# Patient Record
Sex: Female | Born: 1980 | Race: White | Hispanic: No | Marital: Married
Health system: Northeastern US, Academic
[De-identification: ages and names within clinical notes are randomized; demographics above are authoritative.]

## PROBLEM LIST (undated history)

## (undated) ENCOUNTER — Inpatient Hospital Stay (HOSPITAL_COMMUNITY): Payer: Self-pay

## (undated) DIAGNOSIS — D62 Acute posthemorrhagic anemia: Secondary | ICD-10-CM

## (undated) DIAGNOSIS — Z789 Other specified health status: Secondary | ICD-10-CM

## (undated) DIAGNOSIS — O481 Prolonged pregnancy: Secondary | ICD-10-CM

## (undated) HISTORY — PX: WISDOM TOOTH EXTRACTION: SHX21

---

## 2007-11-01 ENCOUNTER — Encounter: Admission: RE | Admit: 2007-11-01 | Discharge: 2007-11-01 | Payer: Self-pay | Admitting: Internal Medicine

## 2008-05-13 ENCOUNTER — Emergency Department (HOSPITAL_COMMUNITY): Admission: EM | Admit: 2008-05-13 | Discharge: 2008-05-13 | Payer: Self-pay | Admitting: Family Medicine

## 2009-10-12 IMAGING — US US PELVIS COMPLETE
1 series · 14 of 25 positions shown · non-contrast
Comparison: None

CLINICAL DATA: Right pelvic pain and pressure.

TRANSABDOMINAL AND TRANSVAGINAL PELVIC ULTRASOUND
TECHNIQUE: Both transabdominal and transvaginal ultrasound examinations of the
pelvis were performed including evaluation of the uterus, ovaries, adnexal
regions, and pelvic cul-de-sac.

[Series 1: us pelvis complete · 0.22mm/px · 14 of 45 slices shown]
[im 1/45]
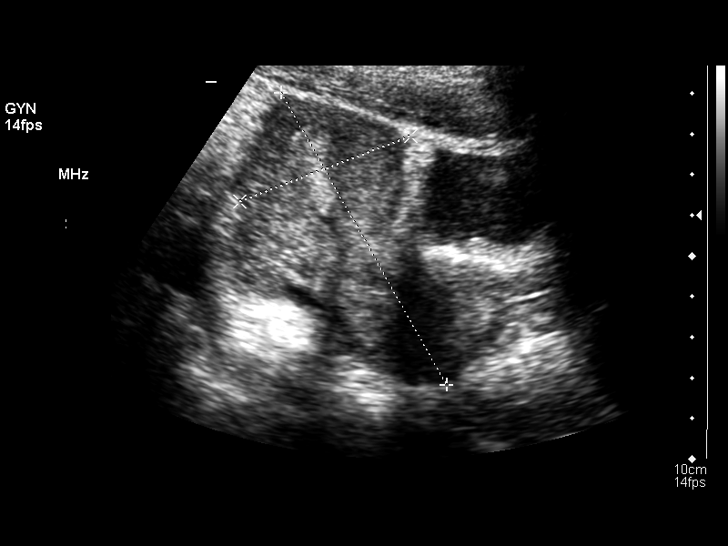
[im 4/45]
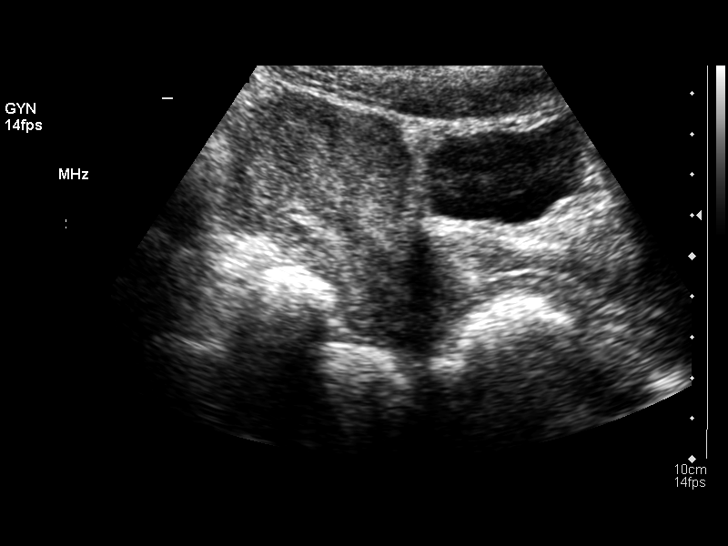
[im 8/45]
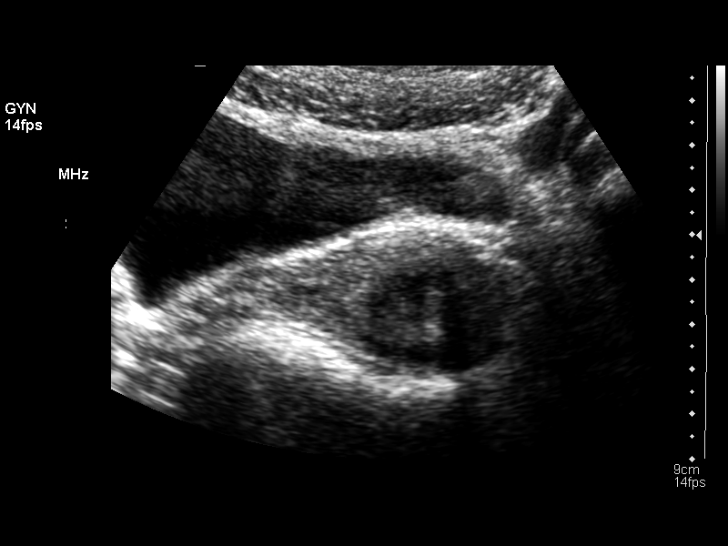
[im 12/45]
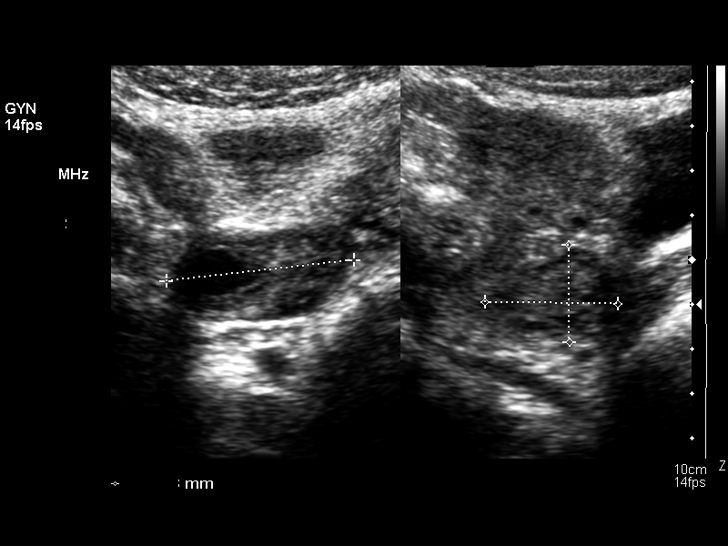
[im 15/45]
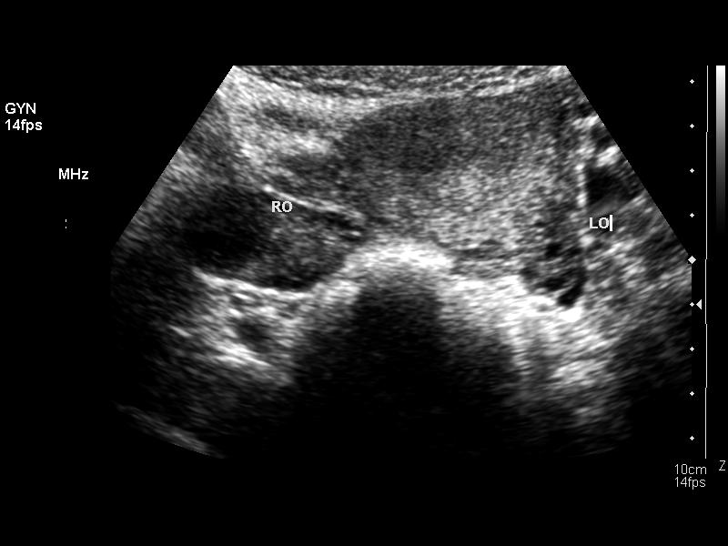
[im 17/45]
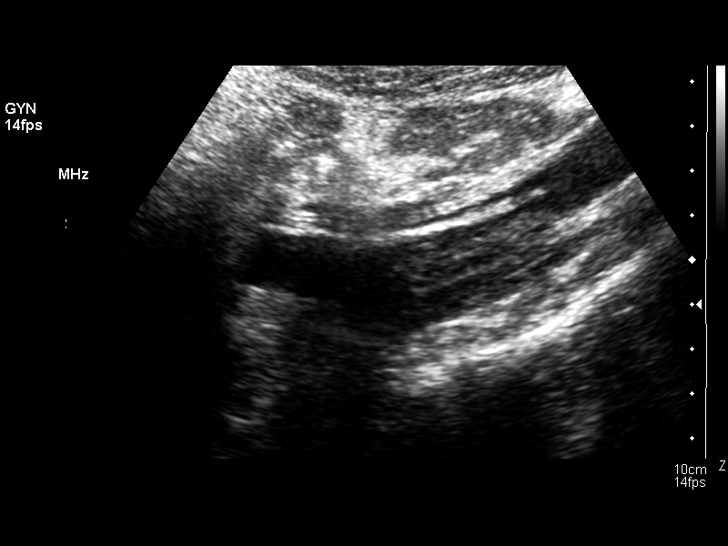
[im 21/45]
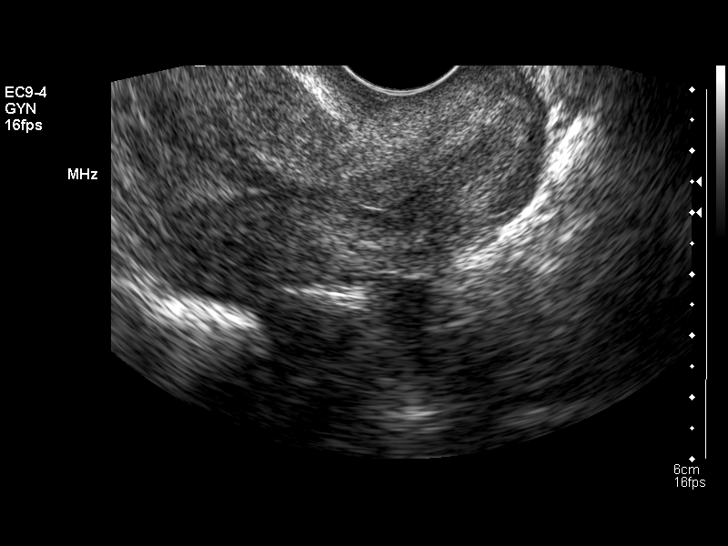
[im 24/45]
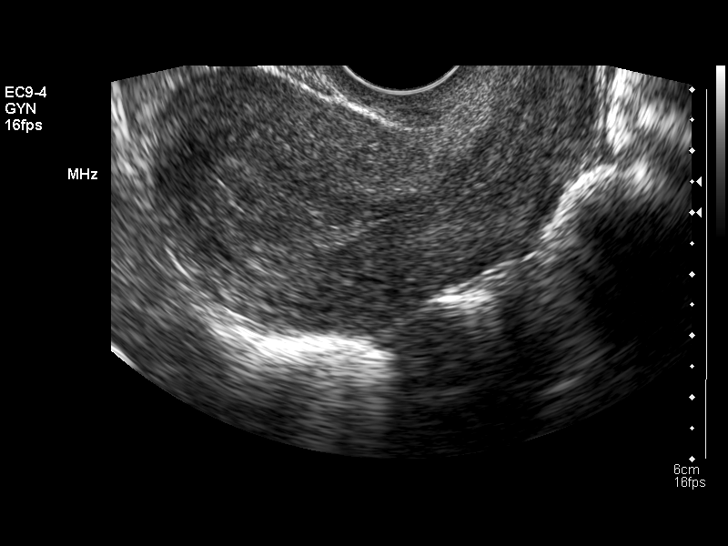
[im 28/45]
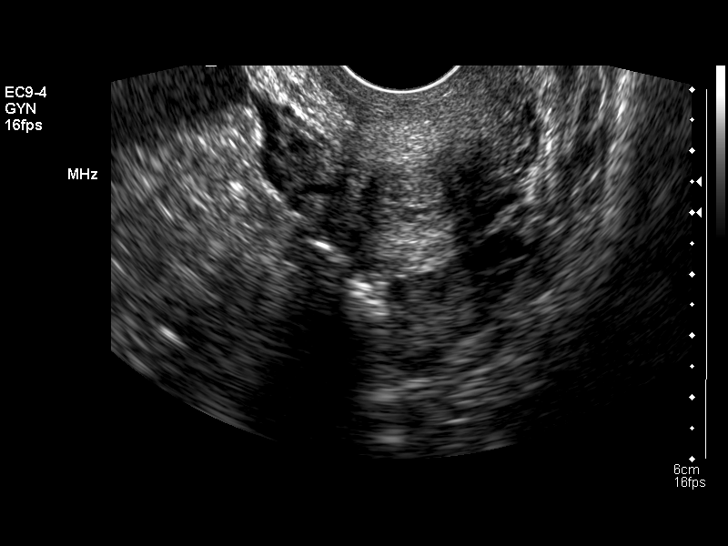
[im 30/45]
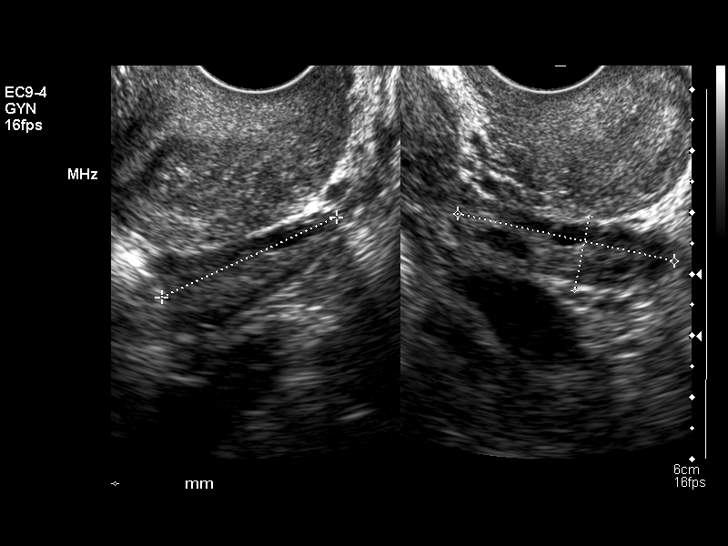
[im 34/45]
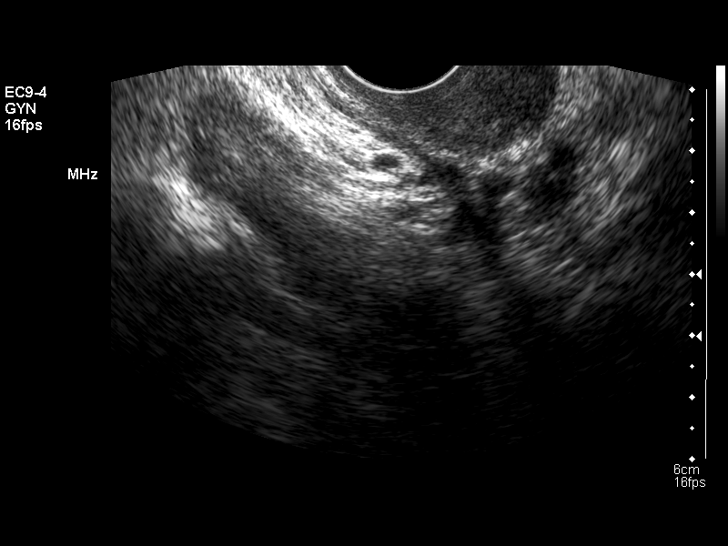
[im 37/45]
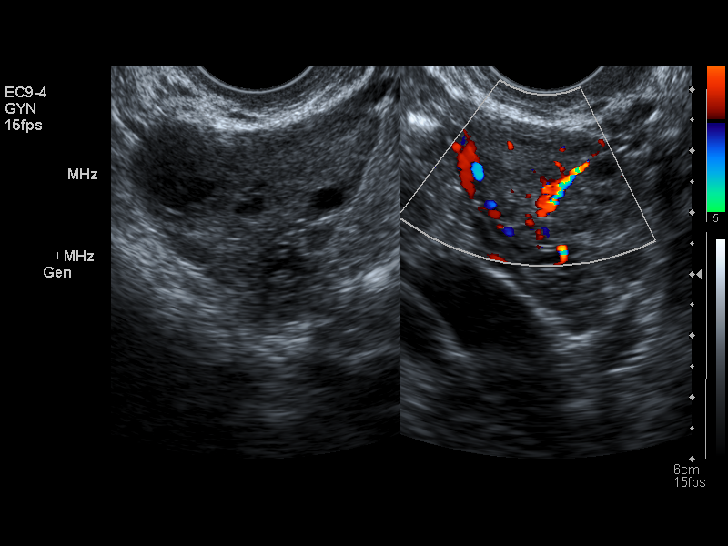
[im 41/45]
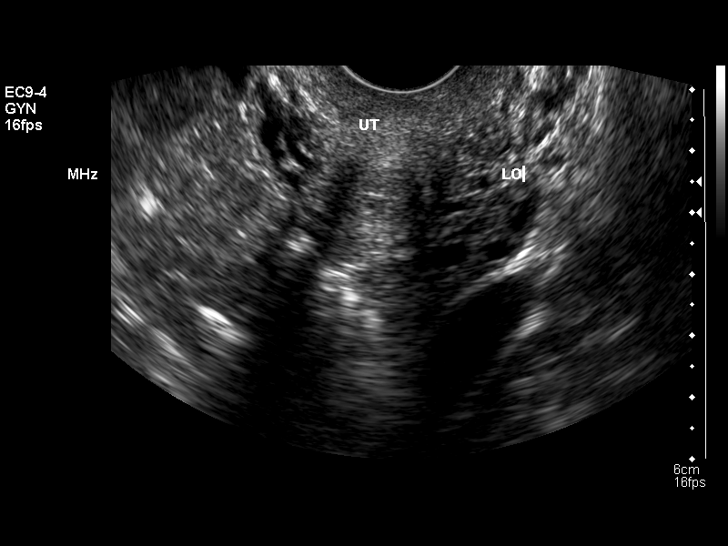
[im 45/45]
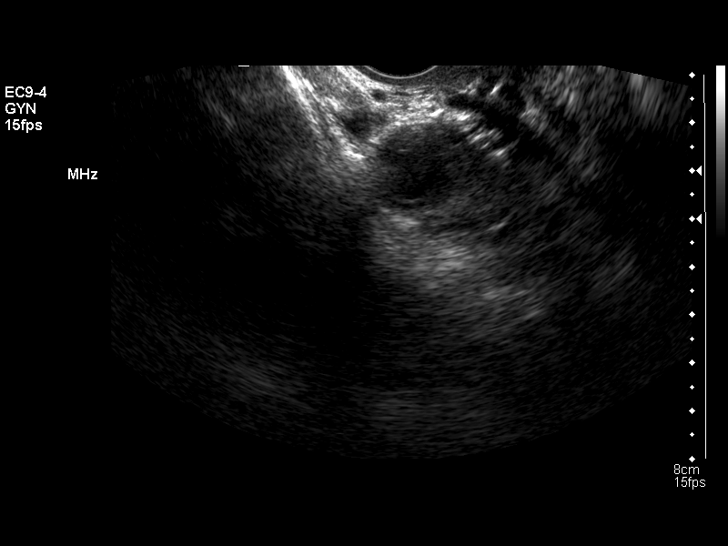

[14 of 25 positions shown; findings below may reference images not displayed]

FINDINGS: Uterus 7.7 x 4.5 x 5.4 cm. Endometrium normal at 1 cm.

Right ovary 3.7 x 2.7 x 4.3 cm. 1.4 cm hypoechoic lesion within is likely a
minimally complex follicle.

Left ovary 3.6 x 1.2 x 3.1 cm. Normal morphology. 

No significant free fluid.

IMPRESSION

1. No acute pelvic process. 
2. likely minimally complex follicle within the right ovary.

## 2011-08-31 LAB — URINE CULTURE

## 2011-08-31 LAB — POCT URINALYSIS DIP (DEVICE)
Glucose, UA: 250 — AB
Operator id: 239701
Specific Gravity, Urine: 1.015
Urobilinogen, UA: 2 — ABNORMAL HIGH

## 2013-12-04 HISTORY — PX: WISDOM TOOTH EXTRACTION: SHX21

## 2014-04-20 ENCOUNTER — Other Ambulatory Visit (HOSPITAL_COMMUNITY)
Admission: RE | Admit: 2014-04-20 | Discharge: 2014-04-20 | Disposition: A | Discharge status: Home / Self Care | Payer: BC Managed Care – PPO | Source: Ambulatory Visit | Attending: Obstetrics & Gynecology | Admitting: Obstetrics & Gynecology

## 2014-04-20 ENCOUNTER — Other Ambulatory Visit: Payer: Self-pay | Admitting: Obstetrics & Gynecology

## 2014-04-20 DIAGNOSIS — Z01419 Encounter for gynecological examination (general) (routine) without abnormal findings: Secondary | ICD-10-CM | POA: Insufficient documentation

## 2014-04-20 DIAGNOSIS — Z1151 Encounter for screening for human papillomavirus (HPV): Secondary | ICD-10-CM | POA: Insufficient documentation

## 2015-08-09 ENCOUNTER — Encounter (HOSPITAL_COMMUNITY): Payer: Self-pay | Admitting: *Deleted

## 2015-08-09 ENCOUNTER — Inpatient Hospital Stay (HOSPITAL_COMMUNITY)
Admission: AD | Admit: 2015-08-09 | Discharge: 2015-08-10 | Disposition: A | Discharge status: Home / Self Care | Payer: BC Managed Care – PPO | Source: Ambulatory Visit | Attending: Obstetrics & Gynecology | Admitting: Obstetrics & Gynecology

## 2015-08-09 DIAGNOSIS — O209 Hemorrhage in early pregnancy, unspecified: Secondary | ICD-10-CM | POA: Diagnosis not present

## 2015-08-09 DIAGNOSIS — O4691 Antepartum hemorrhage, unspecified, first trimester: Secondary | ICD-10-CM | POA: Diagnosis not present

## 2015-08-09 DIAGNOSIS — Z3A10 10 weeks gestation of pregnancy: Secondary | ICD-10-CM | POA: Diagnosis not present

## 2015-08-09 LAB — URINALYSIS, ROUTINE W REFLEX MICROSCOPIC
BILIRUBIN URINE: NEGATIVE
Glucose, UA: NEGATIVE mg/dL
KETONES UR: NEGATIVE mg/dL
Leukocytes, UA: NEGATIVE
NITRITE: NEGATIVE
PROTEIN: NEGATIVE mg/dL
UROBILINOGEN UA: 0.2 mg/dL (ref 0.0–1.0)
pH: 6 (ref 5.0–8.0)

## 2015-08-09 LAB — URINE MICROSCOPIC-ADD ON

## 2015-08-09 NOTE — MAU Note (Signed)
Pt states that she had a "gush of clear fluid around 8pm tonight, then about 2 hours later she had bright red bleeding (no clots) and it was less than a period". Denies pain, states she had very mild cramping earlier but nothing that she was concerned about. She did hike 4 miles today.

## 2015-08-10 ENCOUNTER — Inpatient Hospital Stay (HOSPITAL_COMMUNITY): Payer: BC Managed Care – PPO

## 2015-08-10 DIAGNOSIS — O4691 Antepartum hemorrhage, unspecified, first trimester: Secondary | ICD-10-CM | POA: Diagnosis not present

## 2015-08-10 LAB — WET PREP, GENITAL
Clue Cells Wet Prep HPF POC: NONE SEEN
Trich, Wet Prep: NONE SEEN
Yeast Wet Prep HPF POC: NONE SEEN

## 2015-08-10 LAB — CBC
HCT: 32.6 % — ABNORMAL LOW (ref 36.0–46.0)
HEMOGLOBIN: 11.3 g/dL — AB (ref 12.0–15.0)
MCH: 28.8 pg (ref 26.0–34.0)
MCHC: 34.7 g/dL (ref 30.0–36.0)
MCV: 83.2 fL (ref 78.0–100.0)
Platelets: 236 10*3/uL (ref 150–400)
RBC: 3.92 MIL/uL (ref 3.87–5.11)
RDW: 12.7 % (ref 11.5–15.5)
WBC: 9.7 10*3/uL (ref 4.0–10.5)

## 2015-08-10 LAB — ABO/RH: ABO/RH(D): A NEG

## 2015-08-10 MED ORDER — RHO D IMMUNE GLOBULIN 1500 UNIT/2ML IJ SOSY
300.0000 ug | PREFILLED_SYRINGE | Freq: Once | INTRAMUSCULAR | Status: AC
  Administered 2015-08-10: 300 ug via INTRAMUSCULAR
  Filled 2015-08-10: qty 2

## 2015-08-10 NOTE — Discharge Instructions (Signed)
Vaginal Bleeding During Pregnancy, First Trimester  A small amount of bleeding (spotting) from the vagina is relatively common in early pregnancy. It usually stops on its own. Various things may cause bleeding or spotting in early pregnancy. Some bleeding may be related to the pregnancy, and some may not. In most cases, the bleeding is normal and is not a problem. However, bleeding can also be a sign of something serious. Be sure to tell your health care provider about any vaginal bleeding right away.  Some possible causes of vaginal bleeding during the first trimester include:  · Infection or inflammation of the cervix.  · Growths (polyps) on the cervix.  · Miscarriage or threatened miscarriage.  · Pregnancy tissue has developed outside of the uterus and in a fallopian tube (tubal pregnancy).  · Tiny cysts have developed in the uterus instead of pregnancy tissue (molar pregnancy).  HOME CARE INSTRUCTIONS   Watch your condition for any changes. The following actions may help to lessen any discomfort you are feeling:  · Follow your health care provider's instructions for limiting your activity. If your health care provider orders bed rest, you may need to stay in bed and only get up to use the bathroom. However, your health care provider may allow you to continue light activity.  · If needed, make plans for someone to help with your regular activities and responsibilities while you are on bed rest.  · Keep track of the number of pads you use each day, how often you change pads, and how soaked (saturated) they are. Write this down.  · Do not use tampons. Do not douche.  · Do not have sexual intercourse or orgasms until approved by your health care provider.  · If you pass any tissue from your vagina, save the tissue so you can show it to your health care provider.  · Only take over-the-counter or prescription medicines as directed by your health care provider.  · Do not take aspirin because it can make you  bleed.  · Keep all follow-up appointments as directed by your health care provider.  SEEK MEDICAL CARE IF:  · You have any vaginal bleeding during any part of your pregnancy.  · You have cramps or labor pains.  · You have a fever, not controlled by medicine.  SEEK IMMEDIATE MEDICAL CARE IF:   · You have severe cramps in your back or belly (abdomen).  · You pass large clots or tissue from your vagina.  · Your bleeding increases.  · You feel light-headed or weak, or you have fainting episodes.  · You have chills.  · You are leaking fluid or have a gush of fluid from your vagina.  · You pass out while having a bowel movement.  MAKE SURE YOU:  · Understand these instructions.  · Will watch your condition.  · Will get help right away if you are not doing well or get worse.  Document Released: 08/30/2005 Document Revised: 11/25/2013 Document Reviewed: 07/28/2013  ExitCare® Patient Information ©2015 [employer]. This information is not intended to replace advice given to you by your health care provider. Make sure you discuss any questions you have with your health care provider.

## 2015-08-10 NOTE — MAU Provider Note (Signed)
Chief Complaint: Vaginal Bleeding   First Provider Initiated Contact with Patient 08/09/15 2338      SUBJECTIVE HPI: Stacy Sawyer is a 34 y.o. G2P0010 at [redacted]w[redacted]d by LMP who presents to maternity admissions reporting onset of bright red vaginal bleeding tonight.  She reports a small gush of clear fluid a few hours ago, then bright red vaginal bleeding, lighter than period bleeding without clots, a couple of hours later.  She denies abdominal pain and reports bleeding has stopped by her arrival in MAU.  She has started prenatal care and had initial labs and [place] at [facility].  She does report walking/hiking 4 miles today prior to the bleeding.  She denies vaginal itching/burning, urinary symptoms, h/a, dizziness, n/v, or fever/chills.     Vaginal Bleeding The patient's primary symptoms include vaginal bleeding. The patient's pertinent negatives include no pelvic pain or vaginal discharge. This is a new problem. The current episode started today. Episode frequency: once. The problem has been gradually improving. The patient is experiencing no pain. She is pregnant. Pertinent negatives include no abdominal pain, chills, constipation, diarrhea, dysuria, fever, flank pain, frequency, headaches, nausea or vomiting. The vaginal bleeding is lighter than menses. She has not been passing clots. She has not been passing tissue. The symptoms are aggravated by activity. She has tried nothing for the symptoms. She is sexually active. No, her partner does not have an STD. Her past medical history is significant for miscarriage.    History reviewed. No pertinent past medical history. Past Surgical History  Procedure Laterality Date  . Wisdom tooth extraction  2015   Social History   Social History  . Marital Status: Single    Spouse Name: N/A  . Number of Children: N/A  . Years of Education: N/A   Occupational History  . Not on file.   Social History Main Topics  . Smoking status: Never Smoker   .  Smokeless tobacco: Never Used  . Alcohol Use: No  . Drug Use: No  . Sexual Activity: Yes   Other Topics Concern  . Not on file   Social History Narrative   No current facility-administered medications on file prior to encounter.   No current outpatient prescriptions on file prior to encounter.   Allergies  Allergen Reactions  . Prenatal Vitamins     ROS:  Review of Systems  Constitutional: Negative for fever, chills and fatigue.  HENT: Negative for sinus pressure.   Eyes: Negative for photophobia.  Respiratory: Negative for shortness of breath.   Cardiovascular: Negative for chest pain.  Gastrointestinal: Negative for nausea, vomiting, abdominal pain, diarrhea and constipation.  Genitourinary: Positive for vaginal bleeding. Negative for dysuria, frequency, flank pain, vaginal discharge, difficulty urinating, vaginal pain and pelvic pain.  Musculoskeletal: Negative for neck pain.  Neurological: Negative for dizziness, weakness and headaches.  Psychiatric/Behavioral: Negative.      I have reviewed patient's Past Medical Hx, Surgical Hx, Family Hx, Social Hx, medications and allergies.   Physical Exam   Patient Vitals for the past 24 hrs:  BP Temp Temp src Pulse Resp Height Weight  08/09/15 2328 125/81 mmHg 97.7 F (36.5 C) Oral 86 18  (1.499 m) 57.153 kg (126 lb)   Constitutional: Well-developed, well-nourished female in no acute distress.  Cardiovascular: normal rate Respiratory: normal effort GI: Abd soft, non-tender. Pos BS x 4 MS: Extremities nontender, no edema, normal ROM Neurologic: Alert and oriented x 4.  GU: Neg CVAT.  PELVIC EXAM: Cervix pink, visually closed,  with 0.5 cm round flesh-colored smooth lesion, small amount dark red/brown bleeding, vaginal walls and external genitalia normal   LAB RESULTS Results for orders placed or performed during the hospital encounter of 08/09/15 (from the past 24 hour(s))  Urinalysis, Routine w reflex  microscopic (not at [facility])     Status: Abnormal   Collection Time: 08/09/15 11:00 PM  Result Value Ref Range   Color, Urine YELLOW YELLOW   APPearance CLEAR CLEAR   Specific Gravity, Urine <1.005 (L) 1.005 - 1.030   pH 6.0 5.0 - 8.0   Glucose, UA NEGATIVE NEGATIVE mg/dL   Hgb urine dipstick LARGE (A) NEGATIVE   Bilirubin Urine NEGATIVE NEGATIVE   Ketones, ur NEGATIVE NEGATIVE mg/dL   Protein, ur NEGATIVE NEGATIVE mg/dL   Urobilinogen, UA 0.2 0.0 - 1.0 mg/dL   Nitrite NEGATIVE NEGATIVE   Leukocytes, UA NEGATIVE NEGATIVE  Urine microscopic-add on     Status: None   Collection Time: 08/09/15 11:00 PM  Result Value Ref Range   Bacteria, UA RARE RARE  Wet prep, genital     Status: Abnormal   Collection Time: 08/09/15 11:55 PM  Result Value Ref Range   Yeast Wet Prep HPF POC NONE SEEN NONE SEEN   Trich, Wet Prep NONE SEEN NONE SEEN   Clue Cells Wet Prep HPF POC NONE SEEN NONE SEEN   WBC, Wet Prep HPF POC FEW (A) NONE SEEN  ABO/Rh     Status: None (Preliminary result)   Collection Time: 08/10/15  1:05 AM  Result Value Ref Range   ABO/RH(D) A NEG   CBC     Status: Abnormal   Collection Time: 08/10/15  1:05 AM  Result Value Ref Range   WBC 9.7 4.0 - 10.5 K/uL   RBC 3.92 3.87 - 5.11 MIL/uL   Hemoglobin 11.3 (L) 12.0 - 15.0 g/dL   HCT 16.1 (L) 09.6 - 04.5 %   MCV 83.2 78.0 - 100.0 fL   MCH 28.8 26.0 - 34.0 pg   MCHC 34.7 30.0 - 36.0 g/dL   RDW 40.9 81.1 - 91.4 %   Platelets 236 150 - 400 K/uL    --/--/A NEG (09/06 0105)  IMAGING US Ob Comp Less 14 Wks  08/10/2015   CLINICAL DATA:  34 year old pregnant female with vaginal bleeding.  EXAM: OBSTETRIC <14 WK [place] AND TRANSVAGINAL OB US  TECHNIQUE: Transabdominal ultrasound examinations were performed for evaluation of the gestation as well as the maternal uterus, adnexal regions, and pelvic cul-de-sac.  COMPARISON:  None for this pregnancy  FINDINGS: Intrauterine gestational sac: Visualized/normal in shape.  Yolk sac:  Present  Embryo:   Seen  Cardiac Activity: Detected  Heart Rate: 170  bpm  CRL:  33  mm   10 w   1 d                  [place] EDC: 03/06/2016  Maternal uterus/adnexae: The ovaries are grossly unremarkable. A probable left ovarian corpus luteum noted. 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  IMPRESSION: Single live intrauterine pregnancy with an estimated gestational age of [redacted] weeks, 1 day.   Electronically Signed   By: Elgie Collard M.D.   On: 08/10/2015 01:22    MAU Management/MDM: Ordered labs and reviewed results.  Consult Dr Normand Sloop, reviewed assessment, labs, imaging.  Treatments in MAU included Rhophylac for Rh negative. Pt stable at time of discharge.  ASSESSMENT 1. Vaginal bleeding  in pregnancy, first trimester     PLAN Discharge home Call office this week to follow up Education done regarding negative blood type and Rhophylac Bleeding precautions given/reasons to return to MAU  Follow-up Information    Follow up with Myna Hidalgo, M, DO.   Specialty:  Obstetrics and Gynecology   Why:  Call office to notify of MAU visit.  Follow up as scheduled.   Contact information:   [address] [place] [address] [phone]       Follow up with [facility].   Why:  As needed for emergencies   Contact information:   [address] [id number] mc [place] [address] [phone]      Sharen Counter Certified Nurse-Midwife 08/10/2015  1:45 AM

## 2015-08-11 LAB — RH IG WORKUP (INCLUDES ABO/RH)
ABO/RH(D): A NEG
ANTIBODY SCREEN: NEGATIVE
GESTATIONAL AGE(WKS): 10
Unit division: 0

## 2015-08-25 LAB — OB RESULTS CONSOLE RUBELLA ANTIBODY, IGM: RUBELLA: IMMUNE

## 2015-08-25 LAB — OB RESULTS CONSOLE GC/CHLAMYDIA
CHLAMYDIA, DNA PROBE: NEGATIVE
Gonorrhea: NEGATIVE

## 2015-08-25 LAB — OB RESULTS CONSOLE RPR: RPR: NONREACTIVE

## 2015-08-25 LAB — OB RESULTS CONSOLE ABO/RH: RH TYPE: NEGATIVE

## 2015-08-25 LAB — OB RESULTS CONSOLE HIV ANTIBODY (ROUTINE TESTING): HIV: NONREACTIVE

## 2015-08-25 LAB — OB RESULTS CONSOLE ANTIBODY SCREEN: ANTIBODY SCREEN: NEGATIVE

## 2015-08-25 LAB — OB RESULTS CONSOLE HEPATITIS B SURFACE ANTIGEN: HEP B S AG: NEGATIVE

## 2015-11-03 ENCOUNTER — Encounter (HOSPITAL_COMMUNITY): Payer: Self-pay | Admitting: *Deleted

## 2015-11-03 ENCOUNTER — Inpatient Hospital Stay (HOSPITAL_COMMUNITY)
Admission: AD | Admit: 2015-11-03 | Discharge: 2015-11-03 | Disposition: A | Discharge status: Home / Self Care | Payer: BC Managed Care – PPO | Source: Ambulatory Visit | Attending: Obstetrics & Gynecology | Admitting: Obstetrics & Gynecology

## 2015-11-03 DIAGNOSIS — R197 Diarrhea, unspecified: Secondary | ICD-10-CM | POA: Diagnosis not present

## 2015-11-03 DIAGNOSIS — O26892 Other specified pregnancy related conditions, second trimester: Secondary | ICD-10-CM | POA: Diagnosis not present

## 2015-11-03 DIAGNOSIS — R109 Unspecified abdominal pain: Secondary | ICD-10-CM | POA: Diagnosis present

## 2015-11-03 DIAGNOSIS — Z3A22 22 weeks gestation of pregnancy: Secondary | ICD-10-CM | POA: Diagnosis not present

## 2015-11-03 LAB — COMPREHENSIVE METABOLIC PANEL
ALBUMIN: 3.5 g/dL (ref 3.5–5.0)
ALT: 18 U/L (ref 14–54)
AST: 20 U/L (ref 15–41)
Alkaline Phosphatase: 39 U/L (ref 38–126)
Anion gap: 8 (ref 5–15)
BUN: 7 mg/dL (ref 6–20)
CHLORIDE: 101 mmol/L (ref 101–111)
CO2: 24 mmol/L (ref 22–32)
CREATININE: 0.41 mg/dL — AB (ref 0.44–1.00)
Calcium: 9.2 mg/dL (ref 8.9–10.3)
GFR calc Af Amer: >60 mL/min (ref >60)
GFR calc non Af Amer: >60 mL/min (ref >60)
Glucose, Bld: 108 mg/dL — ABNORMAL HIGH (ref 65–99)
Potassium: 3.6 mmol/L (ref 3.5–5.1)
SODIUM: 133 mmol/L — AB (ref 135–145)
Total Bilirubin: 0.3 mg/dL (ref 0.3–1.2)
Total Protein: 7.6 g/dL (ref 6.5–8.1)

## 2015-11-03 LAB — URINALYSIS, ROUTINE W REFLEX MICROSCOPIC
BILIRUBIN URINE: NEGATIVE
Glucose, UA: NEGATIVE mg/dL
Ketones, ur: NEGATIVE mg/dL
NITRITE: NEGATIVE
PH: 5.5 (ref 5.0–8.0)
Protein, ur: NEGATIVE mg/dL
Specific Gravity, Urine: 1.01 (ref 1.005–1.030)

## 2015-11-03 LAB — CBC
HCT: 32.3 % — ABNORMAL LOW (ref 36.0–46.0)
Hemoglobin: 11.3 g/dL — ABNORMAL LOW (ref 12.0–15.0)
MCH: 29.7 pg (ref 26.0–34.0)
MCHC: 35 g/dL (ref 30.0–36.0)
MCV: 84.8 fL (ref 78.0–100.0)
PLATELETS: 256 10*3/uL (ref 150–400)
RBC: 3.81 MIL/uL — AB (ref 3.87–5.11)
RDW: 13.5 % (ref 11.5–15.5)
WBC: 11.2 10*3/uL — AB (ref 4.0–10.5)

## 2015-11-03 LAB — URINE MICROSCOPIC-ADD ON

## 2015-11-03 NOTE — MAU Provider Note (Signed)
History     CSN: [id number]  Arrival date and time: 11/03/15 2022   First Provider Initiated Contact with Patient 11/03/15 2129      Chief Complaint  Patient presents with  . Abdominal Cramping   HPI Comments: Stacy Sawyer is a 34 y.o. G2P0010 at [redacted]w[redacted]d who presents today with abdominal cramping. She states that she and her husband had food poisoning two days ago. She states that she ate smoked salmon that was out of date. She had vomiting and diarrhea, and those have stopped at this time. She states that she continues to have lower abdominal cramping and "tightness".   Abdominal Cramping This is a new problem. The current episode started in the past 7 days. The onset quality is gradual. The problem occurs intermittently. The problem has been unchanged. The pain is located in the suprapubic region. The pain is at a severity of 6/10. The quality of the pain is cramping. Pertinent negatives include no constipation, diarrhea, dysuria, fever, frequency, nausea or vomiting. Nothing aggravates the pain. The pain is relieved by certain positions and recumbency. She has tried acetaminophen for the symptoms. The treatment provided mild relief.     History reviewed. No pertinent past medical history.  Past Surgical History  Procedure Laterality Date  . Wisdom tooth extraction  2015    History reviewed. No pertinent family history.  Social History  Substance Use Topics  . Smoking status: Never Smoker   . Smokeless tobacco: Never Used  . Alcohol Use: No    Allergies: No Known Allergies  Prescriptions prior to admission  Medication Sig Dispense Refill Last Dose  . acetaminophen (TYLENOL) 500 MG tablet Take 1,000 mg by mouth every 6 (six) hours as needed for mild pain.   11/03/2015 at Unknown time  . omega-3 acid ethyl esters (LOVAZA) 1 G capsule Take 1 g by mouth 2 (two) times daily.   Past Week at Unknown time  . Prenatal Vit-Fe Fumarate-FA (PRENATAL MULTIVITAMIN) TABS tablet Take 1  tablet by mouth daily at 12 noon.   11/03/2015 at Unknown time    Review of Systems  Constitutional: Negative for fever and chills.  Gastrointestinal: Positive for abdominal pain. Negative for nausea, vomiting, diarrhea and constipation.  Genitourinary: Negative for dysuria, urgency and frequency.   Physical Exam   Blood pressure 121/68, pulse 106, temperature 98.2 F (36.8 C), temperature source Oral, resp. rate 18, height  (1.499 m), weight 65.772 kg (145 lb).  Physical Exam  Nursing note and vitals reviewed. Constitutional: She is oriented to person, place, and time. She appears well-developed and well-nourished. No distress.  HENT:  Head: Normocephalic.  Cardiovascular: Normal rate.   Respiratory: Effort normal.  GI: Soft. There is no tenderness. There is no rebound.  Genitourinary:   Cervix: closed/thick/high  FHT with doppler 152 BPM   Neurological: She is alert and oriented to person, place, and time.  Skin: Skin is warm and dry.  Psychiatric: She has a normal mood and affect.   Results for orders placed or performed during the hospital encounter of 11/03/15 (from the past 24 hour(s))  Urinalysis, Routine w reflex microscopic (not at [facility])     Status: Abnormal   Collection Time: 11/03/15  8:37 PM  Result Value Ref Range   Color, Urine YELLOW YELLOW   APPearance CLEAR CLEAR   Specific Gravity, Urine 1.010 1.005 - 1.030   pH 5.5 5.0 - 8.0   Glucose, UA NEGATIVE NEGATIVE mg/dL   Hgb urine dipstick SMALL (  A) NEGATIVE   Bilirubin Urine NEGATIVE NEGATIVE   Ketones, ur NEGATIVE NEGATIVE mg/dL   Protein, ur NEGATIVE NEGATIVE mg/dL   Nitrite NEGATIVE NEGATIVE   Leukocytes, UA TRACE (A) NEGATIVE  Urine microscopic-add on     Status: Abnormal   Collection Time: 11/03/15  8:37 PM  Result Value Ref Range   Squamous Epithelial / LPF 0-5 (A) NONE SEEN   WBC, UA 0-5 0 - 5 WBC/hpf   RBC / HPF 0-5 0 - 5 RBC/hpf   Bacteria, UA RARE (A) NONE SEEN  CBC     Status: Abnormal    Collection Time: 11/03/15  9:50 PM  Result Value Ref Range   WBC 11.2 (H) 4.0 - 10.5 K/uL   RBC 3.81 (L) 3.87 - 5.11 MIL/uL   Hemoglobin 11.3 (L) 12.0 - 15.0 g/dL   HCT 16.132.3 (L) 09.636.0 - 04.546.0 %   MCV 84.8 78.0 - 100.0 fL   MCH 29.7 26.0 - 34.0 pg   MCHC 35.0 30.0 - 36.0 g/dL   RDW 40.913.5 81.111.5 - 91.415.5 %   Platelets 256 150 - 400 K/uL  Comprehensive metabolic panel     Status: Abnormal   Collection Time: 11/03/15  9:50 PM  Result Value Ref Range   Sodium 133 (L) 135 - 145 mmol/L   Potassium 3.6 3.5 - 5.1 mmol/L   Chloride 101 101 - 111 mmol/L   CO2 24 22 - 32 mmol/L   Glucose, Bld 108 (H) 65 - 99 mg/dL   BUN 7 6 - 20 mg/dL   Creatinine, Ser 7.820.41 (L) 0.44 - 1.00 mg/dL   Calcium 9.2 8.9 - 95.610.3 mg/dL   Total Protein 7.6 6.5 - 8.1 g/dL   Albumin 3.5 3.5 - 5.0 g/dL   AST 20 15 - 41 U/L   ALT 18 14 - 54 U/L   Alkaline Phosphatase 39 38 - 126 U/L   Total Bilirubin 0.3 0.3 - 1.2 mg/dL   GFR calc non Af Amer >60 >60 mL/min   GFR calc Af Amer >60 >60 mL/min   Anion gap 8 5 - 15     MAU Course  Procedures  MDM   Assessment and Plan   1. Diarrhea, unspecified type    DC home Comfort measures reviewed  2nd Trimester precautions  PTL precautions  Fetal kick counts RX: none  Return to MAU as needed FU with OB as planned  Follow-up Information    Follow up with Myna HidalgoZAN, JENNIFER, M, DO.   Specialty:  Obstetrics and Gynecology   Why:  As scheduled   Contact information:   [address] [place] [id number] [phone]        Tawnya CrookHogan, Heather Donovan 11/03/2015, 10:32 PM

## 2015-11-03 NOTE — MAU Note (Addendum)
Lower right side pain and cramping that moves above her navel. No bleeding or leaking. Food poisoning from bad salmon.

## 2015-11-03 NOTE — Discharge Instructions (Signed)

## 2016-02-07 LAB — OB RESULTS CONSOLE GBS: GBS: NEGATIVE

## 2016-02-21 ENCOUNTER — Telehealth (HOSPITAL_COMMUNITY): Payer: Self-pay | Admitting: *Deleted

## 2016-02-21 ENCOUNTER — Encounter (HOSPITAL_COMMUNITY): Payer: Self-pay | Admitting: *Deleted

## 2016-02-21 NOTE — Telephone Encounter (Signed)
Preadmission screen  

## 2016-02-23 ENCOUNTER — Observation Stay (HOSPITAL_COMMUNITY)
Admission: RE | Admit: 2016-02-23 | Discharge: 2016-02-23 | Disposition: A | Discharge status: Home / Self Care | Payer: BC Managed Care – PPO | Source: Ambulatory Visit | Attending: Obstetrics and Gynecology | Admitting: Obstetrics and Gynecology

## 2016-02-23 ENCOUNTER — Encounter (HOSPITAL_COMMUNITY): Payer: Self-pay

## 2016-02-23 DIAGNOSIS — O09513 Supervision of elderly primigravida, third trimester: Secondary | ICD-10-CM | POA: Diagnosis not present

## 2016-02-23 DIAGNOSIS — O321XX0 Maternal care for breech presentation, not applicable or unspecified: Principal | ICD-10-CM | POA: Insufficient documentation

## 2016-02-23 DIAGNOSIS — Z3A38 38 weeks gestation of pregnancy: Secondary | ICD-10-CM | POA: Insufficient documentation

## 2016-02-23 MED ORDER — TERBUTALINE SULFATE 1 MG/ML IJ SOLN
INTRAMUSCULAR | Status: AC
  Filled 2016-02-23: qty 1

## 2016-02-23 MED ORDER — TERBUTALINE SULFATE 1 MG/ML IJ SOLN
0.2500 mg | Freq: Once | INTRAMUSCULAR | Status: AC
  Administered 2016-02-23: 0.25 mg via SUBCUTANEOUS

## 2016-02-23 NOTE — Discharge Instructions (Signed)
External Cephalic Version External cephalic version is turning a baby that is presenting his or her buttocks first (breech) or is lying sideways in the uterus (transverse) to a head-first position. This makes the labor and delivery faster, safer for the mother and baby, and lessens the chance for a cesarean section. It should not be tried until the pregnancy is [redacted] weeks along or longer. BEFORE THE PROCEDURE   Do not take aspirin.  Do not eat for 4 hours before the procedure.  Tell your caregiver if you have a cold, fever, or an infection.  Tell your caregiver if you are having contractions.  Tell your caregiver if you are leaking or had a gush of fluid from your vagina.  Tell your caregiver if you have any vaginal bleeding or abnormal discharge.  If you are being admitted the same day, arrive at the hospital at least one hour before the procedure to sign any necessary documents and to get prepared for the procedure.  Tell your caregiver if you had any problems with anesthetics in the past.  Tell your caregiver if you are taking any medications that your caregiver does not know about. This includes over-the-counter and prescription drugs, herbs, eye drops and creams. PROCEDURE  First, an ultrasound is done to make sure the baby is breech or transverse.  A non-stress test or biophysical profile is done on the baby before the ECV. This is done to make sure it is safe for the baby to have the ECV. It may also be done after the procedure to make sure the baby is okay.  ECV is done in the delivery/surgical room with an anesthesiologist present. There should be a setup for an emergency cesarean section with a full nursing and nursery staff available and ready.  The patient may be given a medication to relax the uterine muscles. An epidural may be given for any discomfort. It is helpful for the success of the ECV.  An electronic fetal monitor is placed on the uterus during the procedure to  make sure the baby is okay.  If the mother is Rh-negative, Rho (D) immune globulin will be given to her to prevent Rh problems for future pregnancies.  The mother is followed closely for 2 to 3 hours after the procedure to make sure no problems develop. BENEFITS OF ECV  Easier and safer labor and delivery for the mother and baby.  Lower incidence of cesarean section.  Lower costs with a vaginal delivery. RISKS OF ECV  The placenta pulls away from the wall of the uterus before delivery (abruption of the placenta).  Rupture of the uterus, especially in patients with a previous cesarean section.  Fetal distress.  Early (premature) labor.  Premature rupture of the membranes.  The baby will return to the breech or transverse lie position.  Death of the fetus can happen but is very rare. ECV SHOULD BE STOPPED IF:  The fetal heart tones drop.  The mother is having a lot of pain.  You cannot turn the baby after several attempts. ECV SHOULD NOT BE DONE IF:  The non-stress test or biophysical profile is abnormal.  There is vaginal bleeding.  An abnormal shaped uterus is present.  There is heart disease or uncontrolled high blood pressure in the mother.  There are twins or more.  The placenta covers the opening of the cervix (placenta previa).  You had a previous cesarean section with a classical incision or major surgery of the uterus.  There   is not enough amniotic fluid in the sac (oligohydramnios).  The baby is too small for the pregnancy or has not developed normally (anomaly).  Your membranes have ruptured. HOME CARE INSTRUCTIONS   Have someone take you home after the procedure.  Rest at home for several hours.  Have someone stay with you for a few hours after you get home.  After ECV, continue with your prenatal visits as directed.  Continue your regular diet, rest and activities.  Do not do any strenuous activities for a couple of days. SEEK IMMEDIATE  MEDICAL CARE IF:   You develop vaginal bleeding.  You have fluid coming out of your vagina (bag of water may have broken).  You develop uterine contractions.  You do not feel the baby move or there is less movement of the baby.  You develop abdominal pain.  You develop an oral temperature of 102 F (38.9 C) or higher.   This information is not intended to replace advice given to you by your health care provider. Make sure you discuss any questions you have with your health care provider.   Document Released: 05/15/2007 Document Revised: 12/11/2014 Document Reviewed: 03/10/2009 Elsevier Interactive Patient Education 2016 [employer] Vaginal Delivery During delivery, your health care provider will help you give birth to your baby. During a vaginal delivery, you will work to push the baby out of your vagina. However, before you can push your baby out, a few things need to happen. The opening of your uterus (cervix) has to soften, thin out, and open up (dilate) all the way to 10 cm. Also, your baby has to move down from the uterus into your vagina.  SIGNS OF LABOR  Your health care provider will first need to make sure you are in labor. Signs of labor include:   Passing what is called the mucous plug before labor begins. This is a small amount of blood-stained mucus.  Having regular, painful uterine contractions.   The time between contractions gets shorter.   The discomfort and pain gradually get more intense.  Contraction pains get worse when walking and do not go away when resting.   Your cervix becomes thinner (effacement) and dilates. BEFORE THE DELIVERY Once you are in labor and admitted into the hospital or care center, your health care provider may do the following:   Perform a complete physical exam.  Review any complications related to pregnancy or labor.  Check your blood pressure, pulse, temperature, and heart rate (vital signs).   Determine if, and when,  the rupture of amniotic membranes occurred.  Do a vaginal exam (using a sterile glove and lubricant) to determine:   The position (presentation) of the baby. Is the baby's head presenting first (vertex) in the birth canal (vagina), or are the feet or buttocks first (breech)?   The level (station) of the baby's head within the birth canal.   The effacement and dilatation of the cervix.   An electronic fetal monitor is usually placed on your abdomen when you first arrive. This is used to monitor your contractions and the baby's heart rate.  When the monitor is on your abdomen (external fetal monitor), it can only pick up the frequency and length of your contractions. It cannot tell the strength of your contractions.  If it becomes necessary for your health care provider to know exactly how strong your contractions are or to see exactly what the baby's heart rate is doing, an internal monitor may be inserted into  your vagina and uterus. Your health care provider will discuss the benefits and risks of using an internal monitor and obtain your permission before inserting the device.  Continuous fetal monitoring may be needed if you have an epidural, are receiving certain medicines (such as oxytocin), or have pregnancy or labor complications.  An IV access tube may be placed into a vein in your arm to deliver fluids and medicines if necessary. THREE STAGES OF LABOR AND DELIVERY Normal labor and delivery is divided into three stages. First Stage This stage starts when you begin to contract regularly and your cervix begins to efface and dilate. It ends when your cervix is completely open (fully dilated). The first stage is the longest stage of labor and can last from 3 hours to 15 hours.  Several methods are available to help with labor pain. You and your health care provider will decide which option is best for you. Options include:   Opioid medicines. These are strong pain medicines that you  can get through your IV tube or as a shot into your muscle. These medicines lessen pain but do not make it go away completely.  Epidural. A medicine is given through a thin tube that is inserted in your back. The medicine numbs the lower part of your body and prevents any pain in that area.  Paracervical pain medicine. This is an injection of an anesthetic on each side of your cervix.   You may request natural childbirth, which does not involve the use of pain medicines or an epidural during labor and delivery. Instead, you will use other things, such as breathing exercises, to help cope with the pain. Second Stage The second stage of labor begins when your cervix is fully dilated at 10 cm. It continues until you push your baby down through the birth canal and the baby is born. This stage can take only minutes or several hours.  The location of your baby's head as it moves through the birth canal is reported as a number called a station. If the baby's head has not started its descent, the station is described as being at minus 3 (-3). When your baby's head is at the zero station, it is at the middle of the birth canal and is engaged in the pelvis. The station of your baby helps indicate the progress of the second stage of labor.  When your baby is born, your health care provider may hold the baby with his or her head lowered to prevent amniotic fluid, mucus, and blood from getting into the baby's lungs. The baby's mouth and nose may be suctioned with a small bulb syringe to remove any additional fluid.  Your health care provider may then place the baby on your stomach. It is important to keep the baby from getting cold. To do this, the health care provider will dry the baby off, place the baby directly on your skin (with no blankets between you and the baby), and cover the baby with warm, dry blankets.   The umbilical cord is cut. Third Stage During the third stage of labor, your health care  provider will deliver the placenta (afterbirth) and make sure your bleeding is under control. The delivery of the placenta usually takes about 5 minutes but can take up to 30 minutes. After the placenta is delivered, a medicine may be given either by IV or injection to help contract the uterus and control bleeding. If you are planning to breastfeed, you can try  to do so now. After you deliver the placenta, your uterus should contract and get very firm. If your uterus does not remain firm, your health care provider will massage it. This is important because the contraction of the uterus helps cut off bleeding at the site where the placenta was attached to your uterus. If your uterus does not contract properly and stay firm, you may continue to bleed heavily. If there is a lot of bleeding, medicines may be given to contract the uterus and stop the bleeding.    This information is not intended to replace advice given to you by your health care provider. Make sure you discuss any questions you have with your health care provider.   Document Released: 08/29/2008 Document Revised: 12/11/2014 Document Reviewed: 07/17/2012 Elsevier Interactive Patient Education 2016 Elsevier Inc. Fetal Movement Counts Patient Name: __________________________________________________ Patient Due Date: ____________________ Performing a fetal movement count is highly recommended in high-risk pregnancies, but it is good for every pregnant woman to do. Your health care provider may ask you to start counting fetal movements at 28 weeks of the pregnancy. Fetal movements often increase:  After eating a full meal.  After physical activity.  After eating or drinking something sweet or cold.  At rest. Pay attention to when you feel the baby is most active. This will help you notice a pattern of your baby's sleep and wake cycles and what factors contribute to an increase in fetal movement. It is important to perform a fetal movement  count at the same time each day when your baby is normally most active.  HOW TO COUNT FETAL MOVEMENTS  Find a quiet and comfortable area to sit or lie down on your left side. Lying on your left side provides the best blood and oxygen circulation to your baby.  Write down the day and time on a sheet of paper or in a journal.  Start counting kicks, flutters, swishes, rolls, or jabs in a 2-hour period. You should feel at least 10 movements within 2 hours.  If you do not feel 10 movements in 2 hours, wait 2-3 hours and count again. Look for a change in the pattern or not enough counts in 2 hours. SEEK MEDICAL CARE IF:  You feel less than 10 counts in 2 hours, tried twice.  There is no movement in over an hour.  The pattern is changing or taking longer each day to reach 10 counts in 2 hours.  You feel the baby is not moving as he or she usually does. Date: ____________ Movements: ____________ Start time: ____________ Doreatha Martin time: ____________  Date: ____________ Movements: ____________ Start time: ____________ Doreatha Martin time: ____________ Date: ____________ Movements: ____________ Start time: ____________ Doreatha Martin time: ____________ Date: ____________ Movements: ____________ Start time: ____________ Doreatha Martin time: ____________ Date: ____________ Movements: ____________ Start time: ____________ Doreatha Martin time: ____________ Date: ____________ Movements: ____________ Start time: ____________ Doreatha Martin time: ____________ Date: ____________ Movements: ____________ Start time: ____________ Doreatha Martin time: ____________ Date: ____________ Movements: ____________ Start time: ____________ Doreatha Martin time: ____________  Date: ____________ Movements: ____________ Start time: ____________ Doreatha Martin time: ____________ Date: ____________ Movements: ____________ Start time: ____________ Doreatha Martin time: ____________ Date: ____________ Movements: ____________ Start time: ____________ Doreatha Martin time: ____________ Date: ____________  Movements: ____________ Start time: ____________ Doreatha Martin time: ____________ Date: ____________ Movements: ____________ Start time: ____________ Doreatha Martin time: ____________ Date: ____________ Movements: ____________ Start time: ____________ Doreatha Martin time: ____________ Date: ____________ Movements: ____________ Start time: ____________ Doreatha Martin time: ____________  Date: ____________ Movements: ____________ Start time: ____________ Doreatha Martin time: ____________  Date: ____________ Movements: ____________ Start time: ____________ Doreatha Martin time: ____________ Date: ____________ Movements: ____________ Start time: ____________ Doreatha Martin time: ____________ Date: ____________ Movements: ____________ Start time: ____________ Doreatha Martin time: ____________ Date: ____________ Movements: ____________ Start time: ____________ Doreatha Martin time: ____________ Date: ____________ Movements: ____________ Start time: ____________ Doreatha Martin time: ____________ Date: ____________ Movements: ____________ Start time: ____________ Doreatha Martin time: ____________  Date: ____________ Movements: ____________ Start time: ____________ Doreatha Martin time: ____________ Date: ____________ Movements: ____________ Start time: ____________ Doreatha Martin time: ____________ Date: ____________ Movements: ____________ Start time: ____________ Doreatha Martin time: ____________ Date: ____________ Movements: ____________ Start time: ____________ Doreatha Martin time: ____________ Date: ____________ Movements: ____________ Start time: ____________ Doreatha Martin time: ____________ Date: ____________ Movements: ____________ Start time: ____________ Doreatha Martin time: ____________ Date: ____________ Movements: ____________ Start time: ____________ Doreatha Martin time: ____________  Date: ____________ Movements: ____________ Start time: ____________ Doreatha Martin time: ____________ Date: ____________ Movements: ____________ Start time: ____________ Doreatha Martin time: ____________ Date: ____________ Movements: ____________ Start time:  ____________ Doreatha Martin time: ____________ Date: ____________ Movements: ____________ Start time: ____________ Doreatha Martin time: ____________ Date: ____________ Movements: ____________ Start time: ____________ Doreatha Martin time: ____________ Date: ____________ Movements: ____________ Start time: ____________ Doreatha Martin time: ____________ Date: ____________ Movements: ____________ Start time: ____________ Doreatha Martin time: ____________  Date: ____________ Movements: ____________ Start time: ____________ Doreatha Martin time: ____________ Date: ____________ Movements: ____________ Start time: ____________ Doreatha Martin time: ____________ Date: ____________ Movements: ____________ Start time: ____________ Doreatha Martin time: ____________ Date: ____________ Movements: ____________ Start time: ____________ Doreatha Martin time: ____________ Date: ____________ Movements: ____________ Start time: ____________ Doreatha Martin time: ____________ Date: ____________ Movements: ____________ Start time: ____________ Doreatha Martin time: ____________ Date: ____________ Movements: ____________ Start time: ____________ Doreatha Martin time: ____________  Date: ____________ Movements: ____________ Start time: ____________ Doreatha Martin time: ____________ Date: ____________ Movements: ____________ Start time: ____________ Doreatha Martin time: ____________ Date: ____________ Movements: ____________ Start time: ____________ Doreatha Martin time: ____________ Date: ____________ Movements: ____________ Start time: ____________ Doreatha Martin time: ____________ Date: ____________ Movements: ____________ Start time: ____________ Doreatha Martin time: ____________ Date: ____________ Movements: ____________ Start time: ____________ Doreatha Martin time: ____________ Date: ____________ Movements: ____________ Start time: ____________ Doreatha Martin time: ____________  Date: ____________ Movements: ____________ Start time: ____________ Doreatha Martin time: ____________ Date: ____________ Movements: ____________ Start time: ____________ Doreatha Martin time: ____________ Date:  ____________ Movements: ____________ Start time: ____________ Doreatha Martin time: ____________ Date: ____________ Movements: ____________ Start time: ____________ Doreatha Martin time: ____________ Date: ____________ Movements: ____________ Start time: ____________ Doreatha Martin time: ____________ Date: ____________ Movements: ____________ Start time: ____________ Doreatha Martin time: ____________   This information is not intended to replace advice given to you by your health care provider. Make sure you discuss any questions you have with your health care provider.   Document Released: 12/20/2006 Document Revised: 12/11/2014 Document Reviewed: 09/16/2012 Elsevier Interactive Patient Education [employer]

## 2016-02-23 NOTE — Procedures (Signed)
Confirmed by bedside sonogram:  frank breech  FHR reassuring Forward version started with intermittent FHR check and fetal position done  successful version noted with vertex presentation. Good FHR throughout. Mom tolerated procedure well. Recommend abdominal binder

## 2016-02-23 NOTE — H&P (Signed)
  H&P  CC: ECV HPI:  35 yo G1P0 MWF @ 38 1/[redacted] weeks gestation with frank breech presentation here for ECV. Active fetus. Failed spinning baby and [place] attempts.   PMH: NKA Med PNV Medical: neg Surg; neg  FH noncontributory SH married nonsmoker  PE; WDWN WF gravid No acute distress BP 115/90 mmHg  Pulse 102  Ht 4\' 11"  (1.499 m)  Wt 69.854 kg (154 lb)  BMI 31.09 kg/m2  Abd gravid nontender Breech maternal right Pelvic deferred.  Extr. No edema  Tracing: baseline 130 reactive  IMP: Frank Breech IUP @ 38 1/7 weeks P) [place] terbutaline. ECV. Consent signed for procedure

## 2016-03-22 ENCOUNTER — Telehealth (HOSPITAL_COMMUNITY): Payer: Self-pay | Admitting: *Deleted

## 2016-03-22 NOTE — Progress Notes (Signed)
Spoke with patient regarding IOL tomorrow. Pt verbalizes understanding of kick counts and labor precautions and to come to hospital if decreased FM, LOF, bleeding and or contractions.

## 2016-03-22 NOTE — Telephone Encounter (Signed)
Preadmission screen  

## 2016-03-23 ENCOUNTER — Encounter (HOSPITAL_COMMUNITY): Payer: Self-pay

## 2016-03-23 ENCOUNTER — Inpatient Hospital Stay (HOSPITAL_COMMUNITY)
Admission: RE | Admit: 2016-03-23 | Discharge: 2016-03-27 | DRG: 765 | Disposition: A | Discharge status: Home / Self Care | Payer: BC Managed Care – PPO | Source: Ambulatory Visit | Attending: Obstetrics & Gynecology | Admitting: Obstetrics & Gynecology

## 2016-03-23 VITALS — BP 149/88 | HR 82 | Temp 98.4°F | Resp 19 | Ht 59.0 in | Wt 160.0 lb

## 2016-03-23 DIAGNOSIS — O403XX0 Polyhydramnios, third trimester, not applicable or unspecified: Secondary | ICD-10-CM | POA: Diagnosis present

## 2016-03-23 DIAGNOSIS — Z3A42 42 weeks gestation of pregnancy: Secondary | ICD-10-CM | POA: Diagnosis not present

## 2016-03-23 DIAGNOSIS — O339 Maternal care for disproportion, unspecified: Secondary | ICD-10-CM | POA: Diagnosis present

## 2016-03-23 DIAGNOSIS — O480 Post-term pregnancy: Principal | ICD-10-CM | POA: Diagnosis present

## 2016-03-23 DIAGNOSIS — D62 Acute posthemorrhagic anemia: Secondary | ICD-10-CM | POA: Diagnosis present

## 2016-03-23 DIAGNOSIS — O9081 Anemia of the puerperium: Secondary | ICD-10-CM | POA: Diagnosis present

## 2016-03-23 DIAGNOSIS — Z8249 Family history of ischemic heart disease and other diseases of the circulatory system: Secondary | ICD-10-CM | POA: Diagnosis not present

## 2016-03-23 DIAGNOSIS — O481 Prolonged pregnancy: Secondary | ICD-10-CM

## 2016-03-23 DIAGNOSIS — Z823 Family history of stroke: Secondary | ICD-10-CM | POA: Diagnosis not present

## 2016-03-23 DIAGNOSIS — Z833 Family history of diabetes mellitus: Secondary | ICD-10-CM

## 2016-03-23 HISTORY — DX: Prolonged pregnancy: O48.1

## 2016-03-23 HISTORY — DX: Acute posthemorrhagic anemia: D62

## 2016-03-23 HISTORY — DX: Other specified health status: Z78.9

## 2016-03-23 LAB — TYPE AND SCREEN
ABO/RH(D): A NEG
Antibody Screen: NEGATIVE

## 2016-03-23 LAB — CBC
HCT: 34.6 % — ABNORMAL LOW (ref 36.0–46.0)
Hemoglobin: 12.1 g/dL (ref 12.0–15.0)
MCH: 29.2 pg (ref 26.0–34.0)
MCHC: 35 g/dL (ref 30.0–36.0)
MCV: 83.6 fL (ref 78.0–100.0)
Platelets: 219 10*3/uL (ref 150–400)
RBC: 4.14 MIL/uL (ref 3.87–5.11)
RDW: 14.2 % (ref 11.5–15.5)
WBC: 9.8 10*3/uL (ref 4.0–10.5)

## 2016-03-23 LAB — RPR: RPR Ser Ql: NONREACTIVE

## 2016-03-23 MED ORDER — LIDOCAINE HCL (PF) 1 % IJ SOLN
30.0000 mL | INTRAMUSCULAR | Status: DC | PRN
  Filled 2016-03-23: qty 30

## 2016-03-23 MED ORDER — OXYTOCIN 10 UNIT/ML IJ SOLN
10.0000 [IU] | Freq: Once | INTRAMUSCULAR | Status: DC
  Filled 2016-03-23: qty 1

## 2016-03-23 MED ORDER — CITRIC ACID-SODIUM CITRATE 334-500 MG/5ML PO SOLN
30.0000 mL | ORAL | Status: DC | PRN
  Administered 2016-03-24: 30 mL via ORAL
  Filled 2016-03-23: qty 15

## 2016-03-23 MED ORDER — ACETAMINOPHEN 325 MG PO TABS: 650.0000 mg | ORAL_TABLET | ORAL | Status: DC | PRN

## 2016-03-23 MED ORDER — LACTATED RINGERS IV SOLN: 500.0000 mL | INTRAVENOUS | Status: DC | PRN

## 2016-03-23 NOTE — H&P (Signed)
OB ADMISSION/ HISTORY & PHYSICAL:  Admission Date: 03/23/2016  7:24 AM  Admit Diagnosis: 42.2 weeks / post-term  Galena Logie is a 35 y.o. female presenting for induction of labor.  Prenatal History: G2P0010   EDC : 03/07/2016, by Other Basis  Prenatal care at [facility]  Primary Ob Provider: Fredric Mare CNM Prenatal course complicated by post-dates / mild polyhydramnios  Prenatal Labs: ABO, Rh: A/Negative/-- (09/21 0000) Antibody: Negative (09/21 0000) Rubella: Immune (09/21 0000)  RPR: Nonreactive (09/21 0000)  HBsAg: Negative (09/21 0000)  HIV: Non-reactive (09/21 0000)  GTT: normal GBS: Negative (03/06 0000)   SONO: EFW 8 pounds / AFI 23 / vtx   Medical / Surgical History :  Past medical history:  Past Medical History  Diagnosis Date  . Medical history non-contributory   . Indication for care in labor or delivery 03/23/2016  . Postmaturity pregnancy, greater than [redacted] weeks gestation 03/23/2016     Past surgical history:  Past Surgical History  Procedure Laterality Date  . Wisdom tooth extraction  2015  . Wisdom tooth extraction      Family History:  Family History  Problem Relation Age of Onset  . Hypertension Father   . Hyperlipidemia Father   . Heart disease Father   . Stroke Father   . Alcohol abuse Maternal Aunt   . Diabetes Paternal Aunt   . Cancer Paternal Grandfather   . Arthritis Neg Hx   . Asthma Neg Hx   . Birth defects Neg Hx   . COPD Neg Hx   . Depression Neg Hx   . Drug abuse Neg Hx   . Early death Neg Hx   . Hearing loss Neg Hx   . Kidney disease Neg Hx   . Learning disabilities Neg Hx   . Mental illness Neg Hx   . Mental retardation Neg Hx   . Miscarriages / Stillbirths Neg Hx   . Vision loss Neg Hx   . Varicose Veins Neg Hx      Social History:  reports that she has never smoked. She has never used smokeless tobacco. She reports that she does not drink alcohol or use illicit drugs.   Allergies: Review of patient's  allergies indicates no known allergies.    Current Medications at time of admission:  Prior to Admission medications   Medication Sig Start Date End Date Taking? Authorizing Provider  Evening Primrose Oil 500 MG CAPS Take 2 capsules by mouth 2 (two) times daily. Patient takes 2 capsules orally in the morning and places 2 capsules vaginally at bedtime.   Yes Historical Provider, MD  NON FORMULARY Take 1 capsule by mouth 3 (three) times daily. Patient is taking blue cohosh.   Yes Historical Provider, MD  omega-3 acid ethyl esters (LOVAZA) 1 G capsule Take 2 g by mouth daily.    Yes Historical Provider, MD  Prenatal Vit-Fe Fumarate-FA (PRENATAL MULTIVITAMIN) TABS tablet Take 1 tablet by mouth daily at 12 noon.   Yes Historical Provider, MD   Review of Systems: Active FM irregular ctx  Physical Exam:  VS: Blood pressure 129/79, pulse 85, resp. rate 18, height  (1.499 m), weight 72.576 kg (160 lb).  General: alert and oriented, appears calm and comfortable, understands & agrees with plan of care Heart: RRR Lungs: Clear lung fields Abdomen: Gravid, soft and non-tender, non-distended / uterus: gravid Extremities: trace edema  Genitalia / VE: Dilation: 2 Effacement (%): 80 Station: -1 Exam by:: Khush Pasion cnm  FHR:  baseline rate 135 / variability moderate / accelerations accels / no  decelerations TOCO: mild ctx / UI  Assessment: [redacted] weeks gestation Induction of labor FHR category 1  Plan:  Admit Desires as little intervention as necessary to achieve labor Cervical balloon - sweep membranes - AROM to expectant management Water immersion - desires water birth / considering nitrous oxide  Dr Ernestina PennaFogleman notified of admission / plan of care   Marlinda MikeBAILEY, Radley Teston CNM, MSN, [facility] 03/23/2016, 8:25 AM

## 2016-03-23 NOTE — Progress Notes (Signed)
S: agrees with plan to proceed with AROM at this time  O:  VS: Blood pressure 143/88, pulse 76, temperature 98.6 F (37 C), temperature source Oral, resp. rate 17, height 4\' 11"  (1.499 m), weight 72.576 kg (160 lb).        FHR : baseline 30 / variability moderate / accelerations + / no decelerations        Toco: contractions every 4 minutes / mild        Cervix : 5-6/ 90% / vtx 0 station        Membranes: AROM - clear  A: active labor     FHR category 1  P: expectant management   Marlinda MikeBAILEY, TANYA CNM, MSN, [facility] HospitalFACNM 03/23/2016, 4:35 PM

## 2016-03-23 NOTE — Progress Notes (Signed)
S:  ctx painful x 1 hour        breathing with ctx / activity ad lib / labor support with family & doula  O:  VS: Blood pressure 155/96, pulse 85, temperature 98.5 F (36.9 C), temperature source Oral, resp. rate 16, height 4\' 11"  (1.499 m), weight 72.576 kg (160 lb), unknown if currently breastfeeding.        FHR : baseline 130 / variability moderate / accelerations + / no decelerations        Toco: contractions every 3 minutes / moderate        Cervix : deferrec        Membranes: clear fluid / small bloody show         Water temp 99.4  A: active labor     FHR category 1 with intermittent EFM & auscultation     Increased BP in past few hours - no PIH symptoms - increased pain  P: expectant management       water immersion x 60-90 minutes         reassess with intermission out of tub (BP and cervical check)           consider PEC labs with persistent or worsening BP   Marlinda MikeBAILEY, Destane Speas CNM, MSN, FACNM 03/23/2016, 11:54 PM

## 2016-03-23 NOTE — Progress Notes (Signed)
S:  Not feeling ctx x 2 hours  O:  VS: Blood pressure 139/86, pulse 76, temperature 98.3 F (36.8 C), temperature source Oral, resp. rate 16, height 4\' 11"  (1.499 m), weight 72.576 kg (160 lb).        EFM applied        Cervix : balloon in vagina                      5-6cm / 90% / vtx -1        Membranes: BBOW - membranes swept  A: induction of labor    P: re-assess FHR and ctx status to determine best recommendation for next steps    Marlinda MikeBAILEY, Maddix Kliewer CNM, MSN, [facility] Center[facility]ACNM 03/23/2016, 3:53 PM

## 2016-03-24 ENCOUNTER — Encounter (HOSPITAL_COMMUNITY): Payer: Self-pay

## 2016-03-24 ENCOUNTER — Inpatient Hospital Stay (HOSPITAL_COMMUNITY): Payer: BC Managed Care – PPO | Admitting: Anesthesiology

## 2016-03-24 ENCOUNTER — Encounter (HOSPITAL_COMMUNITY)
Admission: RE | Disposition: A | Discharge status: Home / Self Care | Payer: Self-pay | Source: Ambulatory Visit | Attending: Obstetrics & Gynecology

## 2016-03-24 LAB — CBC
HCT: 37.5 % (ref 36.0–46.0)
Hemoglobin: 13.1 g/dL (ref 12.0–15.0)
MCH: 29 pg (ref 26.0–34.0)
MCHC: 34.9 g/dL (ref 30.0–36.0)
MCV: 83 fL (ref 78.0–100.0)
Platelets: 223 10*3/uL (ref 150–400)
RBC: 4.52 MIL/uL (ref 3.87–5.11)
RDW: 14.1 % (ref 11.5–15.5)
WBC: 21.5 10*3/uL — ABNORMAL HIGH (ref 4.0–10.5)

## 2016-03-24 LAB — COMPREHENSIVE METABOLIC PANEL
ALT: 27 U/L (ref 14–54)
AST: 24 U/L (ref 15–41)
Albumin: 3.1 g/dL — ABNORMAL LOW (ref 3.5–5.0)
Alkaline Phosphatase: 116 U/L (ref 38–126)
Anion gap: 8 (ref 5–15)
BUN: 11 mg/dL (ref 6–20)
CO2: 18 mmol/L — ABNORMAL LOW (ref 22–32)
Calcium: 8.9 mg/dL (ref 8.9–10.3)
Chloride: 104 mmol/L (ref 101–111)
Creatinine, Ser: 0.5 mg/dL (ref 0.44–1.00)
GFR calc Af Amer: >60 mL/min (ref >60)
GFR calc non Af Amer: >60 mL/min (ref >60)
Glucose, Bld: 121 mg/dL — ABNORMAL HIGH (ref 65–99)
Potassium: 3.9 mmol/L (ref 3.5–5.1)
Sodium: 130 mmol/L — ABNORMAL LOW (ref 135–145)
Total Bilirubin: <0.1 mg/dL — ABNORMAL LOW (ref 0.3–1.2)
Total Protein: 6.4 g/dL — ABNORMAL LOW (ref 6.5–8.1)

## 2016-03-24 LAB — URIC ACID: Uric Acid, Serum: 5.5 mg/dL (ref 2.3–6.6)

## 2016-03-24 SURGERY — Surgical Case
Anesthesia: Epidural

## 2016-03-24 MED ORDER — DIPHENHYDRAMINE HCL 25 MG PO CAPS: 25.0000 mg | ORAL_CAPSULE | ORAL | Status: DC | PRN

## 2016-03-24 MED ORDER — MORPHINE SULFATE (PF) 0.5 MG/ML IJ SOLN
INTRAMUSCULAR | Status: AC
  Filled 2016-03-24: qty 10

## 2016-03-24 MED ORDER — ONDANSETRON HCL 4 MG/2ML IJ SOLN: 4.0000 mg | Freq: Once | INTRAMUSCULAR | Status: DC | PRN

## 2016-03-24 MED ORDER — PHENYLEPHRINE 40 MCG/ML (10ML) SYRINGE FOR IV PUSH (FOR BLOOD PRESSURE SUPPORT): 80.0000 ug | PREFILLED_SYRINGE | INTRAVENOUS | Status: DC | PRN

## 2016-03-24 MED ORDER — CEFAZOLIN SODIUM-DEXTROSE 2-3 GM-% IV SOLR
INTRAVENOUS | Status: DC | PRN
Start: 2016-03-24 — End: 2016-03-24
  Administered 2016-03-24: 2 g via INTRAVENOUS

## 2016-03-24 MED ORDER — LACTATED RINGERS IV BOLUS (SEPSIS)
300.0000 mL | Freq: Once | INTRAVENOUS | Status: AC
  Administered 2016-03-24: 300 mL via INTRAVENOUS

## 2016-03-24 MED ORDER — ONDANSETRON HCL 4 MG/2ML IJ SOLN: 4.0000 mg | Freq: Three times a day (TID) | INTRAMUSCULAR | Status: DC | PRN

## 2016-03-24 MED ORDER — OXYCODONE-ACETAMINOPHEN 5-325 MG PO TABS: 2.0000 | ORAL_TABLET | ORAL | Status: DC | PRN

## 2016-03-24 MED ORDER — DIPHENHYDRAMINE HCL 25 MG PO CAPS: 25.0000 mg | ORAL_CAPSULE | Freq: Four times a day (QID) | ORAL | Status: DC | PRN

## 2016-03-24 MED ORDER — ONDANSETRON HCL 4 MG/2ML IJ SOLN
INTRAMUSCULAR | Status: DC | PRN
  Administered 2016-03-24: 4 mg via INTRAVENOUS

## 2016-03-24 MED ORDER — FAMOTIDINE IN NACL 20-0.9 MG/50ML-% IV SOLN
20.0000 mg | Freq: Once | INTRAVENOUS | Status: AC
  Administered 2016-03-24: 20 mg via INTRAVENOUS
  Filled 2016-03-24: qty 50

## 2016-03-24 MED ORDER — OXYTOCIN 10 UNIT/ML IJ SOLN
40.0000 [IU] | INTRAVENOUS | Status: DC | PRN
  Administered 2016-03-24: 40 [IU] via INTRAVENOUS

## 2016-03-24 MED ORDER — SIMETHICONE 80 MG PO CHEW
80.0000 mg | CHEWABLE_TABLET | Freq: Three times a day (TID) | ORAL | Status: DC
  Administered 2016-03-24 – 2016-03-27 (×6): 80 mg via ORAL
  Filled 2016-03-24 (×6): qty 1

## 2016-03-24 MED ORDER — TETANUS-DIPHTH-ACELL PERTUSSIS 5-2.5-18.5 LF-MCG/0.5 IM SUSP: 0.5000 mL | Freq: Once | INTRAMUSCULAR | Status: DC

## 2016-03-24 MED ORDER — OXYTOCIN 10 UNIT/ML IJ SOLN
1.0000 m[IU]/min | INTRAVENOUS | Status: DC
  Administered 2016-03-24: 2 m[IU]/min via INTRAVENOUS

## 2016-03-24 MED ORDER — ZOLPIDEM TARTRATE 5 MG PO TABS: 5.0000 mg | ORAL_TABLET | Freq: Every evening | ORAL | Status: DC | PRN

## 2016-03-24 MED ORDER — FENTANYL 2.5 MCG/ML BUPIVACAINE 1/10 % EPIDURAL INFUSION (WH - ANES)
14.0000 mL/h | INTRAMUSCULAR | Status: DC | PRN
  Administered 2016-03-24 (×2): 14 mL/h via EPIDURAL
  Filled 2016-03-24: qty 125

## 2016-03-24 MED ORDER — SCOPOLAMINE 1 MG/3DAYS TD PT72
1.0000 | MEDICATED_PATCH | Freq: Once | TRANSDERMAL | Status: DC
  Filled 2016-03-24: qty 1

## 2016-03-24 MED ORDER — NALBUPHINE HCL 10 MG/ML IJ SOLN: 5.0000 mg | INTRAMUSCULAR | Status: DC | PRN

## 2016-03-24 MED ORDER — IBUPROFEN 600 MG PO TABS
600.0000 mg | ORAL_TABLET | Freq: Four times a day (QID) | ORAL | Status: DC
  Administered 2016-03-25 – 2016-03-27 (×8): 600 mg via ORAL
  Filled 2016-03-24 (×8): qty 1

## 2016-03-24 MED ORDER — OXYTOCIN 10 UNIT/ML IJ SOLN
INTRAMUSCULAR | Status: AC
  Filled 2016-03-24: qty 4

## 2016-03-24 MED ORDER — ONDANSETRON HCL 4 MG/2ML IJ SOLN
INTRAMUSCULAR | Status: AC
  Filled 2016-03-24: qty 2

## 2016-03-24 MED ORDER — LACTATED RINGERS IV SOLN
INTRAVENOUS | Status: DC
  Administered 2016-03-24: 16:00:00 via INTRAVENOUS

## 2016-03-24 MED ORDER — DIBUCAINE 1 % RE OINT: 1.0000 "application " | TOPICAL_OINTMENT | RECTAL | Status: DC | PRN

## 2016-03-24 MED ORDER — FENTANYL CITRATE (PF) 100 MCG/2ML IJ SOLN: 25.0000 ug | INTRAMUSCULAR | Status: DC | PRN

## 2016-03-24 MED ORDER — NALOXONE HCL 2 MG/2ML IJ SOSY
1.0000 ug/kg/h | PREFILLED_SYRINGE | INTRAVENOUS | Status: DC | PRN
  Filled 2016-03-24: qty 2

## 2016-03-24 MED ORDER — WITCH HAZEL-GLYCERIN EX PADS
1.0000 | MEDICATED_PAD | CUTANEOUS | Status: DC | PRN
Start: 2016-03-24 — End: 2016-03-27

## 2016-03-24 MED ORDER — DIPHENHYDRAMINE HCL 50 MG/ML IJ SOLN: 12.5000 mg | INTRAMUSCULAR | Status: DC | PRN

## 2016-03-24 MED ORDER — PHENYLEPHRINE 40 MCG/ML (10ML) SYRINGE FOR IV PUSH (FOR BLOOD PRESSURE SUPPORT)
80.0000 ug | PREFILLED_SYRINGE | INTRAVENOUS | Status: DC | PRN
  Filled 2016-03-24: qty 20

## 2016-03-24 MED ORDER — ONDANSETRON HCL 4 MG/2ML IJ SOLN
4.0000 mg | Freq: Four times a day (QID) | INTRAMUSCULAR | Status: DC | PRN
  Filled 2016-03-24: qty 2

## 2016-03-24 MED ORDER — KETOROLAC TROMETHAMINE 30 MG/ML IJ SOLN
30.0000 mg | Freq: Four times a day (QID) | INTRAMUSCULAR | Status: AC | PRN
  Administered 2016-03-24: 30 mg via INTRAMUSCULAR

## 2016-03-24 MED ORDER — LACTATED RINGERS IV SOLN
500.0000 mL | Freq: Once | INTRAVENOUS | Status: AC
  Administered 2016-03-24: 500 mL via INTRAVENOUS

## 2016-03-24 MED ORDER — EPHEDRINE 5 MG/ML INJ: 10.0000 mg | INTRAVENOUS | Status: DC | PRN

## 2016-03-24 MED ORDER — SODIUM CHLORIDE 0.9% FLUSH: 3.0000 mL | INTRAVENOUS | Status: DC | PRN

## 2016-03-24 MED ORDER — OXYCODONE-ACETAMINOPHEN 5-325 MG PO TABS: 1.0000 | ORAL_TABLET | ORAL | Status: DC | PRN

## 2016-03-24 MED ORDER — SODIUM BICARBONATE 8.4 % IV SOLN
INTRAVENOUS | Status: AC
  Filled 2016-03-24: qty 50

## 2016-03-24 MED ORDER — SIMETHICONE 80 MG PO CHEW: 80.0000 mg | CHEWABLE_TABLET | ORAL | Status: DC | PRN

## 2016-03-24 MED ORDER — LACTATED RINGERS IV SOLN: 2.5000 [IU]/h | INTRAVENOUS | Status: AC

## 2016-03-24 MED ORDER — MENTHOL 3 MG MT LOZG
1.0000 | LOZENGE | OROMUCOSAL | Status: DC | PRN
Start: 2016-03-24 — End: 2016-03-27

## 2016-03-24 MED ORDER — KETOROLAC TROMETHAMINE 30 MG/ML IJ SOLN
30.0000 mg | Freq: Four times a day (QID) | INTRAMUSCULAR | Status: AC | PRN
  Administered 2016-03-24: 30 mg via INTRAVENOUS
  Filled 2016-03-24: qty 1

## 2016-03-24 MED ORDER — MORPHINE SULFATE (PF) 0.5 MG/ML IJ SOLN
INTRAMUSCULAR | Status: DC | PRN
  Administered 2016-03-24: 3 mg via EPIDURAL

## 2016-03-24 MED ORDER — PRENATAL MULTIVITAMIN CH
1.0000 | ORAL_TABLET | Freq: Every day | ORAL | Status: DC
Start: 2016-03-24 — End: 2016-03-27
  Administered 2016-03-25 – 2016-03-27 (×3): 1 via ORAL
  Filled 2016-03-24 (×3): qty 1

## 2016-03-24 MED ORDER — LACTATED RINGERS IV SOLN
INTRAVENOUS | Status: DC | PRN
  Administered 2016-03-24 (×2): via INTRAVENOUS

## 2016-03-24 MED ORDER — LIDOCAINE HCL (PF) 1 % IJ SOLN
INTRAMUSCULAR | Status: DC | PRN
  Administered 2016-03-24 (×2): 5 mL via EPIDURAL

## 2016-03-24 MED ORDER — ACETAMINOPHEN 500 MG PO TABS
1000.0000 mg | ORAL_TABLET | Freq: Four times a day (QID) | ORAL | Status: AC
  Administered 2016-03-24 – 2016-03-25 (×3): 1000 mg via ORAL
  Filled 2016-03-24 (×3): qty 2

## 2016-03-24 MED ORDER — KETOROLAC TROMETHAMINE 30 MG/ML IJ SOLN
INTRAMUSCULAR | Status: AC
  Administered 2016-03-24: 30 mg via INTRAVENOUS
  Filled 2016-03-24: qty 1

## 2016-03-24 MED ORDER — FENTANYL CITRATE (PF) 100 MCG/2ML IJ SOLN
INTRAMUSCULAR | Status: DC | PRN
  Administered 2016-03-24: 50 ug via INTRAVENOUS

## 2016-03-24 MED ORDER — LIDOCAINE-EPINEPHRINE (PF) 2 %-1:200000 IJ SOLN
INTRAMUSCULAR | Status: AC
Start: 2016-03-24 — End: 2016-03-24
  Filled 2016-03-24: qty 20

## 2016-03-24 MED ORDER — LACTATED RINGERS IV SOLN: INTRAVENOUS | Status: DC

## 2016-03-24 MED ORDER — NALOXONE HCL 0.4 MG/ML IJ SOLN: 0.4000 mg | INTRAMUSCULAR | Status: DC | PRN

## 2016-03-24 MED ORDER — SENNOSIDES-DOCUSATE SODIUM 8.6-50 MG PO TABS
2.0000 | ORAL_TABLET | ORAL | Status: DC
  Administered 2016-03-24 – 2016-03-26 (×3): 2 via ORAL
  Filled 2016-03-24 (×3): qty 2

## 2016-03-24 MED ORDER — SODIUM BICARBONATE 8.4 % IV SOLN
INTRAVENOUS | Status: DC | PRN
  Administered 2016-03-24 (×3): 5 mL via EPIDURAL

## 2016-03-24 MED ORDER — SIMETHICONE 80 MG PO CHEW
80.0000 mg | CHEWABLE_TABLET | ORAL | Status: DC
  Administered 2016-03-24 – 2016-03-26 (×3): 80 mg via ORAL
  Filled 2016-03-24 (×3): qty 1

## 2016-03-24 MED ORDER — MEPERIDINE HCL 25 MG/ML IJ SOLN: 6.2500 mg | INTRAMUSCULAR | Status: DC | PRN

## 2016-03-24 MED ORDER — OXYTOCIN 10 UNIT/ML IJ SOLN: 1.0000 m[IU]/min | INTRAVENOUS | Status: DC

## 2016-03-24 MED ORDER — FENTANYL CITRATE (PF) 100 MCG/2ML IJ SOLN
INTRAMUSCULAR | Status: AC
  Filled 2016-03-24: qty 2

## 2016-03-24 MED ORDER — COCONUT OIL OIL: 1.0000 "application " | TOPICAL_OIL | Status: DC | PRN

## 2016-03-24 SURGICAL SUPPLY — 36 items
BENZOIN TINCTURE PRP APPL 2/3 (GAUZE/BANDAGES/DRESSINGS) ×3 IMPLANT
CHLORAPREP W/TINT 26ML (MISCELLANEOUS) ×3 IMPLANT
CLAMP CORD UMBIL (MISCELLANEOUS) IMPLANT
CLOSURE WOUND 1/2 X4 (GAUZE/BANDAGES/DRESSINGS) ×1
CLOTH BEACON ORANGE TIMEOUT ST (SAFETY) ×3 IMPLANT
CONTAINER PREFILL 10% NBF 15ML (MISCELLANEOUS) IMPLANT
DRAPE C SECTION CLR SCREEN (DRAPES) ×3 IMPLANT
DRSG OPSITE POSTOP 4X10 (GAUZE/BANDAGES/DRESSINGS) ×3 IMPLANT
ELECT REM PT RETURN 9FT ADLT (ELECTROSURGICAL) ×3
ELECTRODE REM PT RTRN 9FT ADLT (ELECTROSURGICAL) ×1 IMPLANT
EXTRACTOR VACUUM KIWI (MISCELLANEOUS) IMPLANT
EXTRACTOR VACUUM M CUP 4 TUBE (SUCTIONS) IMPLANT
EXTRACTOR VACUUM M CUP 4' TUBE (SUCTIONS)
GLOVE BIO SURGEON STRL SZ7 (GLOVE) ×3 IMPLANT
GLOVE BIOGEL PI IND STRL 7.0 (GLOVE) ×3 IMPLANT
GLOVE BIOGEL PI INDICATOR 7.0 (GLOVE) ×6
GOWN STRL REUS W/TWL LRG LVL3 (GOWN DISPOSABLE) ×6 IMPLANT
KIT ABG SYR 3ML LUER SLIP (SYRINGE) IMPLANT
NEEDLE HYPO 25X5/8 SAFETYGLIDE (NEEDLE) IMPLANT
NS IRRIG 1000ML POUR BTL (IV SOLUTION) ×3 IMPLANT
PACK C SECTION WH (CUSTOM PROCEDURE TRAY) ×3 IMPLANT
PAD OB MATERNITY 4.3X12.25 (PERSONAL CARE ITEMS) ×3 IMPLANT
RTRCTR C-SECT PINK 25CM LRG (MISCELLANEOUS) IMPLANT
STRIP CLOSURE SKIN 1/2X4 (GAUZE/BANDAGES/DRESSINGS) ×2 IMPLANT
SUT MON AB-0 CT1 36 (SUTURE) ×9 IMPLANT
SUT PLAIN 0 NONE (SUTURE) IMPLANT
SUT PLAIN 2 0 (SUTURE)
SUT PLAIN ABS 2-0 CT1 27XMFL (SUTURE) IMPLANT
SUT VIC AB 0 CT1 27 (SUTURE) ×4
SUT VIC AB 0 CT1 27XBRD ANBCTR (SUTURE) ×2 IMPLANT
SUT VIC AB 2-0 CT1 27 (SUTURE) ×6
SUT VIC AB 2-0 CT1 TAPERPNT 27 (SUTURE) ×3 IMPLANT
SUT VIC AB 4-0 KS 27 (SUTURE) IMPLANT
SUT VICRYL 0 TIES 12 18 (SUTURE) IMPLANT
TOWEL OR 17X24 6PK STRL BLUE (TOWEL DISPOSABLE) ×3 IMPLANT
TRAY FOLEY CATH SILVER 14FR (SET/KITS/TRAYS/PACK) IMPLANT

## 2016-03-24 NOTE — Transfer of Care (Signed)
Immediate Anesthesia Transfer of Care Note  Patient: Stacy HumphreyJane Winquist  Procedure(s) Performed: Procedure(s): CESAREAN SECTION (N/A)  Patient Location: PACU  Anesthesia Type:Epidural  Level of Consciousness: awake, alert  and oriented  Airway & Oxygen Therapy: Patient Spontanous Breathing  Post-op Assessment: Report given to RN and Post -op Vital signs reviewed and stable  Post vital signs: Reviewed and stable  Last Vitals:  Filed Vitals:   03/24/16 1157 03/24/16 1158  BP: 140/84   Pulse: 81 75  Temp:    Resp: 18 22    Complications: No apparent anesthesia complications

## 2016-03-24 NOTE — Op Note (Signed)
Procedure Note  Stacy HumphreyJane Kastelic  Primary Low transverse Cesarean Section  Indications: Dystocia - cephalopelvic disproportion. Arrest of dilatation.   Pre-operative Diagnosis: Arrest of Dilatation in active labor. 42.3 wks   Post-operative Diagnosis: Same   Surgeon:  Shea EvansVaishali Shavawn Stobaugh, MD - Primary   Assistants: Philipp Ovensollita Dawson, CNM   Anesthesia: Epidural   Procedure Details:  The patient was seen in the Labor Room and evaluated and arrest of labor at 6-7 cm was diagnosed, from Cephalopelvic disproportion. Cesarean delivery advised. The risks, benefits, complications, treatment options, and expected outcomes were discussed with the patient. The patient concurred with the proposed plan, giving informed consent. identified as Stacy Sawyer and the procedure verified as C-Section Delivery. A Time Out was held and the above information confirmed.  2 gm Ancef given. After induction of anesthesia, the patient was draped and prepped in the usual sterile manner and foley was draining well.  A Pfannenstiel Incision was made and carried down through the subcutaneous tissue to the fascia. Fascial incision was made and extended transversely. The fascia was separated from the underlying rectus tissue superiorly and inferiorly. The peritoneum was identified and entered. Peritoneal incision was extended longitudinally. Alexis retractor placed. The utero-vesical peritoneal reflection was incised transversely and the bladder flap was bluntly freed from the lower uterine segment. A low transverse uterine incision was made. Meconium stained fluid noted. Delivered from cephalic presentation right occiput transverse, undescended head was a vigorous female infant at 11.08 am on 03/24/2016  with Apgar scores of 9 at one minute and 9 at five minutes. Cord ph was not sent,  the umbilical cord was clamped after 1 minute, cut cord blood was obtained for evaluation. The placenta was removed Intact and appeared normal. The uterine outline,  tubes and ovaries appeared normal. The uterine incision was closed with running locked sutures of 0Monocryl and a second imbricating layer take with 0Monocryl. Hemostasis was observed. Alexis retractor removed. Peritoneal closure done with 2-0Vicryl.  The fascia was then reapproximated with running sutures of 0Vicryl. The subcuticular closure was performed using 4-0Vicryl. Steristrips and honeycomb dressing placed.    Instrument, sponge, and needle counts were correct prior the abdominal closure and were correct at the conclusion of the case.    Findings: Female infant, in Right OT position undescended. Delivered cephalic with Apgars 9 and 9, delayed cord clamping done. Normal tubes and ovaries. Placenta, cord normal, 3v cord.    Estimated Blood Loss: 700 cc   Total IV Fluids:  1400 ml LR   Urine Output: 200CC OF clear urine  Specimens: cord blood   Complications: no complications  Disposition: PACU - hemodynamically stable.   Maternal Condition: stable   Baby condition / location:  Couplet care / Skin to Skin  Attending Attestation: I performed the procedure.   Signed: Surgeon(s): Shea EvansVaishali Kaulder Zahner, MD

## 2016-03-24 NOTE — Lactation Note (Addendum)
This note was copied from a baby's chart. Lactation Consultation Note Initial visit at 9 hours of age.  Mom reports a few good feedings.  Baby has been on left breast on and off for about 40 minutes.  Mom has short shafted nipples with right nipple flattening with compression.  Hand pump used to help evert nipples.  Laid back position attempted to help latch on right breast.  Baby buried face into breast. Lc assisted with repositioning to help.  Mom is attentive to baby latching well.  Baby sucks for a few minutes and then stops needing stimulation to maintain feeding.  [facility] given and discussed.  Encouraged to feed with early cues on demand.  Early newborn behavior discussed.  Hand expression demonstrated with colostrum visible and expressed to baby's mouth with latch.  Mom to call for assist as needed. Report given to RN at bedside.    Patient Name: Stacy Hulda HumphreyJane Hey RUEAV'WToday's Date: 03/24/2016 Reason for consult: Initial assessment   Maternal Data Has patient been taught Hand Expression?: Yes Does the patient have breastfeeding experience prior to this delivery?: No  Feeding Feeding Type: Breast Fed Length of feed:  (several minutes observed)  LATCH Score/Interventions Latch: Grasps breast easily, tongue down, lips flanged, rhythmical sucking. Intervention(s): Adjust position;Assist with latch;Breast massage;Breast compression  Audible Swallowing: A few with stimulation Intervention(s): Skin to skin;Hand expression;Alternate breast massage  Type of Nipple: Everted at rest and after stimulation (short shaft nipples. right flattens with compression) Intervention(s): Hand pump  Comfort (Breast/Nipple): Soft / non-tender     Hold (Positioning): Assistance needed to correctly position infant at breast and maintain latch. Intervention(s): Breastfeeding basics reviewed;Support Pillows;Position options;Skin to skin  LATCH Score: 8  Lactation Tools Discussed/Used Tools:  Pump Breast pump type: Manual Initiated by:: RN Date initiated:: 03/24/16   Consult Status Consult Status: Follow-up Date: 03/25/16 Follow-up type: In-patient    Jannifer RodneyShoptaw, Jana Lynn 03/24/2016, 9:02 PM

## 2016-03-24 NOTE — Progress Notes (Signed)
Patient ID: Stacy HumphreyJane Sawyer, female   DOB: 07/27/1981, 35 y.o.   MRN: [id number] S: "I feel at peace and in a state of ecstasy. Feeling like I can do this.", pain well-managed with an epidural.    O: Filed Vitals:   03/24/16 0732 03/24/16 0802 03/24/16 0832 03/24/16 0902  BP: 145/89 149/83 137/87 149/87  Pulse: 92 92 96 92  Temp:      TempSrc:      Resp: 18 18 18 18   Height:      Weight:      SpO2:         FHT:  FHR: 130 bpm, variability: moderate,  accelerations:  Present,  decelerations:  Present variables UC:   regular, every 2-4 minutes / MVUs 90-130 SVE:   Dilation: 7 (edematous; feels more like 6 cm) Effacement (%): 70 Station: 0 / small caput, persistent OP position Exam by:: Stacy Sawyer, CNM unchanged  A / P: Protracted active phase - unchanged cervical exam x 9.5 hrs Inadequate labor pattern Postdates >42 wks AMA  Fetal Wellbeing:  Category I Pain Control:  Epidural  Anticipated MOD:  Cesarean Delivery   *Discussed with patient and family that there has been no change in cervical exam or descent x 9.5 hrs despite pitocin augmentation, epidural, various position changes, and IUPC. Concern a vaginal delivery will not be successful as evidenced by swelling cervix (dilation of 7 cm, but feels like 6 cm). Recommend CS for arrest of dilatation and descent. Understanding verbalized by patient and family - all are open to idea of CS at this time. Dr. Juliene PinaMody notified - in room to examine patient and further discuss with patient and family the plan to proceed with CS. R/B of surgery / anticipatory guidance intraoperatively and initial postpartum discussed by Dr. Juliene PinaMody and CNM. Pitocin OFF - prep for surgery initiated. OR notified.   Stacy MoraAWSON, Stacy Sawyer, M MSN, CNM 03/24/2016, 9:36 AM

## 2016-03-24 NOTE — Anesthesia Preprocedure Evaluation (Signed)
Anesthesia Evaluation  Patient identified by MRN, date of birth, ID band Patient awake    Reviewed: Allergy & Precautions, NPO status , Patient's Chart, lab work & pertinent test results  Airway Mallampati: II  TM Distance: >3 FB Neck ROM: Full    Dental  (+) Teeth Intact, Dental Advisory Given   Pulmonary neg pulmonary ROS,    Pulmonary exam normal        Cardiovascular negative cardio ROS Normal cardiovascular exam     Neuro/Psych negative neurological ROS  negative psych ROS   GI/Hepatic negative GI ROS, Neg liver ROS,   Endo/Other  negative endocrine ROS  Renal/GU negative Renal ROS  negative genitourinary   Musculoskeletal negative musculoskeletal ROS (+)   Abdominal   Peds negative pediatric ROS (+)  Hematology negative hematology ROS (+)   Anesthesia Other Findings   Reproductive/Obstetrics (+) Pregnancy                             Anesthesia Physical Anesthesia Plan  ASA: II  Anesthesia Plan: Epidural   Post-op Pain Management:    Induction:   Airway Management Planned:   Additional Equipment:   Intra-op Plan:   Post-operative Plan:   Informed Consent: I have reviewed the patients History and Physical, chart, labs and discussed the procedure including the risks, benefits and alternatives for the proposed anesthesia with the patient or authorized representative who has indicated his/her understanding and acceptance.   Dental advisory given  Plan Discussed with: Anesthesiologist  Anesthesia Plan Comments:         Anesthesia Quick Evaluation  

## 2016-03-24 NOTE — Addendum Note (Signed)
Addendum  created 03/24/16 1616 by Graciela HusbandsWynn O Cloy Cozzens, CRNA   Modules edited: Notes Section   Notes Section:  File: [id number]

## 2016-03-24 NOTE — Progress Notes (Signed)
S:   urethral pain with pressure with ctx        Comfortable with epidural except urethral pain - resting some in past 45 minutes  O:  VS: Blood pressure 149/79, pulse 87, temperature 98.4 F (36.9 C), temperature source Oral, resp. rate 18, height 4\' 11"  (1.499 m), weight 72.576 kg (160 lb), last menstrual period 06/01/2015, SpO2 98 %, unknown if currently breastfeeding.         PIH labs reviewed - normal except hemoconcentration         FHR : baseline 130 / variability moderate / accelerations accels / few variable decelerations        Toco: contractions every 3-4 minutes / 60 / mild - moderate        Cervix : 6cm with thickened cervix - cervical balloon in front of head at pubic arch                      Foley balloon deflated and advanced then refilled (pain resolved)                      6cm / 80% generalized edema of cervix but more anterior edema                      vtx anterior asynclitic 0 station with caput to +1 /                      attempt to reposition vtx position to resolve asynclinticism- easily displaced out of pelvis        Membranes: clear fluid        Foley scant drainage - now 300ml dark urine with pink tinge  A:   protracted labor with persistent OP presentation / asyncliticism        possible deep transverse arrest        cervical edema with etiology OP / deep transverse arrest / poorly placed foley balloon       FHR category 2       elevated BP prior to epidural (likely relational pain) / PIH labs NL -  no evidence of PEC  P:   Discussed cervical exam & recommendation to augment with pitocin to attempt to facilitate fetal rotation with progression in cervical dilation to 10cm to attempt vaginal birth  IUPC placed to titrate pitocin to achieve adequate MVUs   Recheck 2 hours after adequate MVUs - consult MD & initiate CS discussion with family     Marlinda MikeBAILEY, Shernita Rabinovich CNM, MSN, [facility] 03/24/2016, 6:44 AM

## 2016-03-24 NOTE — Anesthesia Postprocedure Evaluation (Signed)
Anesthesia Post Note  Patient: Stacy HumphreyJane Sawyer  Procedure(s) Performed: Procedure(s) (LRB): CESAREAN SECTION (N/A)  Patient location during evaluation: Mother Baby Anesthesia Type: Epidural Level of consciousness: awake and alert Pain management: satisfactory to patient Vital Signs Assessment: post-procedure vital signs reviewed and stable Respiratory status: respiratory function stable Cardiovascular status: stable Postop Assessment: no headache, no backache, epidural receding, patient able to bend at knees, no signs of nausea or vomiting and adequate PO intake Anesthetic complications: no Comments: Comfort level was assessed by AnesthesiaTeam and the patient was pleased with the care, interventions, and services provided by the Department of Anesthesia.    Last Vitals:  Filed Vitals:   03/24/16 1430 03/24/16 1530  BP: 118/72 119/72  Pulse: 72 74  Temp: 37.1 C 37.1 C  Resp: 18 18    Last Pain:  Filed Vitals:   03/24/16 1557  PainSc: 2                  Alese Furniss

## 2016-03-24 NOTE — Progress Notes (Signed)
S:  requesting alternative pain management - wants epidural           states too tired to cope / + nausea  O:  VS: Blood pressure 148/91, pulse 89, temperature 98.6 F (37 C), temperature source Oral, resp. rate 20, height 4\' 11"  (1.499 m), weight 72.576 kg (160 lb), SpO2 97 %, unknown if currently breastfeeding.        FHR : baseline 130s intermittent while in tub        Toco: contractions every 2-4 minutes / moderate         cervix : 7cm / 90% slightly edematous anterior / vtx 0 small caput        nausea with vomiting x 2  Labored x 30 minutes in pool / out ad lib & labored lateral position with peanut with nitrous x 1 1/2 hour then re-attempted pool but restless in water and pain not as controlled as when in bed with nitrous   A: active labor       fatigue      elevated BP 140-150/90s in labor  P: start IV     Ok nitrous while preparing for epidural     PIH labs     place epidural then lateral rest with peanut     recheck in 2-4 hours            monitor ctx pattern and descent            place IUPC & discuss pitocin in no progression with epidural & positioning    Marlinda MikeBAILEY, Erie Radu CNM, MSN, [facility] 03/24/2016, 3:41 AM

## 2016-03-24 NOTE — Anesthesia Procedure Notes (Signed)
Epidural Patient location during procedure: OB Start time: 03/24/2016 4:31 AM End time: 03/24/2016 4:45 AM  Staffing Anesthesiologist: Heather RobertsSINGER, Montanna Mcbain Performed by: anesthesiologist   Preanesthetic Checklist Completed: patient identified, site marked, pre-op evaluation, timeout performed, IV checked, risks and benefits discussed and monitors and equipment checked  Epidural Patient position: sitting Prep: DuraPrep Patient monitoring: heart rate, cardiac monitor, continuous pulse ox and blood pressure Approach: midline Location: L2-L3 Injection technique: LOR saline  Needle:  Needle type: Tuohy  Needle gauge: 17 G Needle length: 9 cm Needle insertion depth: 6 cm Catheter size: 20 Guage Catheter at skin depth: 11 cm Test dose: negative and Other  Assessment Events: blood not aspirated, injection not painful, no injection resistance and negative IV test  Additional Notes Informed consent obtained prior to proceeding including risk of failure, 1% risk of PDPH, risk of minor discomfort and bruising.  Discussed rare but serious complications including epidural abscess, permanent nerve injury, epidural hematoma.  Discussed alternatives to epidural analgesia and patient desires to proceed.  Timeout performed pre-procedure verifying patient name, procedure, and platelet count.  Patient tolerated procedure well.

## 2016-03-24 NOTE — Anesthesia Postprocedure Evaluation (Signed)
Anesthesia Post Note  Patient: Stacy Sawyer  Procedure(s) Performed: Procedure(s) (LRB): CESAREAN SECTION (N/A)  Patient location during evaluation: PACU Anesthesia Type: Epidural Level of consciousness: awake and alert Pain management: pain level controlled Vital Signs Assessment: post-procedure vital signs reviewed and stable Respiratory status: spontaneous breathing, nonlabored ventilation, respiratory function stable and patient connected to nasal cannula oxygen Cardiovascular status: blood pressure returned to baseline and stable Postop Assessment: no signs of nausea or vomiting and epidural receding Anesthetic complications: no    Last Vitals:  Filed Vitals:   03/24/16 1300 03/24/16 1326  BP: 124/76 112/65  Pulse: 81 80  Temp:  36.8 C  Resp: 29 20    Last Pain:  Filed Vitals:   03/24/16 1327  PainSc: 4                  Damare Serano JENNETTE

## 2016-03-24 NOTE — Progress Notes (Signed)
Hulda HumphreyJane Aldaco is a 35 y.o. G3P0010 at 4338w3d ,  Labor IOL, progressed slowly and now arrest of labor at 6-7 cm with no descent.  Pt c/o UCs getting painful and also back pain getting worse.   Objective: BP 139/71 mmHg  Pulse 87  Temp(Src) 98.4 F (36.9 C) (Oral)  Resp 18  Ht 4\' 11"  (1.499 m)  Wt 160 lb (72.576 kg)  BMI 32.30 kg/m2  SpO2 98%  LMP 06/01/2015  Breastfeeding? Unknown I/O last 3 completed shifts: In: -  Out: 375 [Emesis/NG output:375] Total I/O In: -  Out: 450 [Urine:450]  FHT:  FHR: 150 bpm, variability: moderate,  accelerations:  Present,  decelerations:  Present variable decels  UC:  regular, every 3-4 minutes SVE: 6-7 swollen cervix, head -2 with only caput in pelvis    Assessment / Plan: Arrest in active phase of labor, category I  Proceed with Cesarean section.   Risks/complications of surgery reviewed incl infection, bleeding, damage to internal organs including bladder, bowels, ureters, blood vessels, other risks from anesthesia, VTE and delayed complications of any surgery, complications in future surgery reviewed. Also discussed neonatal complications incl difficult delivery, laceration, vacuum assistance, TTN etc. Pt understands and agrees, all concerns addressed.     Nyxon Strupp R 03/24/2016, 10:21 AM

## 2016-03-25 ENCOUNTER — Encounter (HOSPITAL_COMMUNITY): Payer: Self-pay

## 2016-03-25 DIAGNOSIS — D62 Acute posthemorrhagic anemia: Secondary | ICD-10-CM

## 2016-03-25 HISTORY — DX: Acute posthemorrhagic anemia: D62

## 2016-03-25 LAB — CBC
HEMATOCRIT: 26.9 % — AB (ref 36.0–46.0)
HEMOGLOBIN: 9.4 g/dL — AB (ref 12.0–15.0)
MCH: 29.4 pg (ref 26.0–34.0)
MCHC: 34.9 g/dL (ref 30.0–36.0)
MCV: 84.1 fL (ref 78.0–100.0)
Platelets: 167 10*3/uL (ref 150–400)
RBC: 3.2 MIL/uL — ABNORMAL LOW (ref 3.87–5.11)
RDW: 14.6 % (ref 11.5–15.5)
WBC: 11.5 10*3/uL — ABNORMAL HIGH (ref 4.0–10.5)

## 2016-03-25 MED ORDER — POLYSACCHARIDE IRON COMPLEX 150 MG PO CAPS
150.0000 mg | ORAL_CAPSULE | Freq: Every day | ORAL | Status: DC
  Administered 2016-03-25 – 2016-03-27 (×3): 150 mg via ORAL
  Filled 2016-03-25 (×3): qty 1

## 2016-03-25 MED ORDER — MAGNESIUM OXIDE 400 (241.3 MG) MG PO TABS
400.0000 mg | ORAL_TABLET | Freq: Every day | ORAL | Status: DC
  Administered 2016-03-25 – 2016-03-26 (×2): 400 mg via ORAL
  Filled 2016-03-25 (×4): qty 1

## 2016-03-25 NOTE — Progress Notes (Signed)
Patient ID: Stacy Sawyer, female   DOB: 01/23/1981, 35 y.o.   MRN: [id number] Subjective: S/P Primary Cesarean Delivery d/t Arrest of Dilatation and Arrest of Descent POD# 1 Information for the patient's newborn:  Stacy Sawyer, [name] [[id number]][[id number]]  female  / circ NOT planning  Reports feeling tired, but well. Feeding: breast Patient reports tolerating PO.  Breast symptoms: small amount of colostrum Pain controlled with ibuprofen (OTC) and narcotic analgesics including Percocet Denies HA/SOB/C/P/N/V/dizziness. Flatus absent. No BM. She reports vaginal bleeding as normal, without clots.  She is ambulating, not urinating on her own yet. RN to assist pt to BR in a few mins.     Objective:   VS:  Filed Vitals:   03/24/16 1835 03/24/16 2030 03/25/16 0030 03/25/16 0500  BP:  101/53 114/64 120/75  Pulse:  76 72 73  Temp: 98.4 F (36.9 C) 98.5 F (36.9 C) 98.8 F (37.1 C) 97.7 F (36.5 C)  TempSrc: Oral Oral Oral Oral  Resp:  18 18 18   Height:      Weight:      SpO2:  97%       Intake/Output Summary (Last 24 hours) at 03/25/16 0841 Last data filed at 03/25/16 0518  Gross per 24 hour  Intake 4806.67 ml  Output   4400 ml  Net 406.67 ml        Recent Labs  03/24/16 0357 03/25/16 0609  WBC 21.5* 11.5*  HGB 13.1 9.4*  HCT 37.5 26.9*  PLT 223 167     Blood type: --/--/A NEG (04/20 0848) / Infant Rh NEG - Rhophylac NOT indicated  Rubella: Immune (09/21 0000)     Physical Exam:   General: alert, cooperative, fatigued and no distress  CV: Regular rate and rhythm, S1S2 present or without murmur or extra heart sounds  Resp: clear  Abdomen: soft, nontender, normal bowel sounds  Incision: clean, dry and intact  Uterine Fundus: firm, 1 FB below umbilicus, nontender  Lochia: minimal  Ext: edema trace and Homans sign is negative, no sign of DVT   Assessment/Plan: 35 y.o.   POD# 1.  S/P Cesarean Delivery.  Indications: arrest of dilatation and descent                Principal  Problem:   Postpartum care following cesarean delivery (4/21) Active Problems:   Indication for care in labor or delivery   Post term pregnancy, 42 weeks   Cesarean delivery delivered   Postoperative anemia due to acute blood loss  Doing well, stable.               Regular diet as tolerated D/C IV per unit protocol Ambulate Routine post-op care  Stacy GowerAWSON, Stacy Sawyer, M, MSN, CNM 03/25/2016, 8:41 AM

## 2016-03-26 MED ORDER — NALBUPHINE HCL 10 MG/ML IJ SOLN: 5.0000 mg | Freq: Once | INTRAMUSCULAR | Status: DC | PRN

## 2016-03-26 NOTE — Lactation Note (Signed)
This note was copied from a baby's chart. Lactation Consultation Note; Mom complaining of very sore left nipple and has not put him to that breast today. Offered assist with latch and mom agreeable. Baby awake and showing feeding cues Has not fed in 5 hours, Assisted mom with latch on left nipple Mom reports pain the first few minutes that then eases off and mom reports mostly tugging after that. Reviewed basic teaching with mom. Encouraged parents to watch for feeding cues and feed whenever they see them Encouraged to feed at least every 3 hours or more often with cues. Baby nursed on both breasts for total of 35 min and still nursing as I left room. Much encouragement given to parents. Encouraged mom to sleep when baby finished feeding. No questions at present. Has DEBP in room but I did not assist with it since baby had nursed on both breasts and mom is so tired. Has Medela pump for home. To call for assist prn  Patient Name: Stacy Sawyer: 03/26/2016 Reason for consult: Follow-up assessment   Maternal Data Formula Feeding for Exclusion: No Has patient been taught Hand Expression?: Yes Does the patient have breastfeeding experience prior to this delivery?: No  Feeding Feeding Type: Breast Fed  LATCH Score/Interventions Latch: Grasps breast easily, tongue down, lips flanged, rhythmical sucking.  Audible Swallowing: A few with stimulation  Type of Nipple: Everted at rest and after stimulation  Comfort (Breast/Nipple): Filling, red/small blisters or bruises, mild/mod discomfort  Problem noted: Mild/Moderate discomfort Interventions (Mild/moderate discomfort): Hand expression;Pre-pump if needed  Hold (Positioning): Assistance needed to correctly position infant at breast and maintain latch. Intervention(s): Breastfeeding basics reviewed;Position options  LATCH Score: 7  Lactation Tools Discussed/Used     Consult Status Consult Status: Follow-up Sawyer:  03/27/16 Follow-up type: In-patient    Pamelia HoitWeeks, Faten Frieson D 03/26/2016, 4:43 PM

## 2016-03-26 NOTE — Progress Notes (Signed)
I tried again to set pt  up with double electric breast pump. Pt refused again. Advised pt to let me know when she was ready for instruction. Will continue to monitor pt.

## 2016-03-26 NOTE — Progress Notes (Signed)
I attempted to set pt up with double electric breast pump. Mother refused at this time. She stated " I am extremely tired and would like to wait a little." Advised pt to let me know when she was ready for instruction on double electric breast pump. Will continue to monitor pt.

## 2016-03-26 NOTE — Progress Notes (Signed)
Patient ID: Stacy HumphreyJane Trigg, female   DOB: 06/24/1981, 35 y.o.   MRN: [id number] Subjective: S/P Primary Cesarean Delivery d/t Arrest of Dilatation and Arrest of Descent POD# 2 Information for the patient's newborn:  Jeanann Lewandowskylkins, Boy Alexya [[id number]][[id number]]  female   / circ NOT planning  Reports feeling tired, but well. Feeding: breast Patient reports tolerating PO.  Breast symptoms: small amount of colostrum Pain controlled with ibuprofen (OTC) and narcotic analgesics including Percocet Denies HA/SOB/C/P/N/V/dizziness. Flatus present. (+) BM. She reports vaginal bleeding as normal, without clots.  She is ambulating, urinating well on her own.     Objective:   VS:  Filed Vitals:   03/25/16 0925 03/25/16 1400 03/25/16 1700 03/26/16 0417  BP: 119/79 121/82 129/78 129/86  Pulse: 76 87 87 79  Temp: 98.2 F (36.8 C) 98.9 F (37.2 C) 98.7 F (37.1 C) 98 F (36.7 C)  TempSrc: Oral Oral  Oral  Resp: 20 18 18 18   Height:      Weight:      SpO2: 99%         Intake/Output Summary (Last 24 hours) at 03/26/16 0851 Last data filed at 03/25/16 1400  Gross per 24 hour  Intake      0 ml  Output   1725 ml  Net  -1725 ml         Recent Labs  03/24/16 0357 03/25/16 0609  WBC 21.5* 11.5*  HGB 13.1 9.4*  HCT 37.5 26.9*  PLT 223 167     Blood type: --/--/A NEG (04/20 0848) / Infant Rh NEG - Rhophylac NOT indicated  Rubella: Immune (09/21 0000)     Physical Exam:   General: alert, cooperative, fatigued and no distress  CV: Regular rate and rhythm, S1S2 present or without murmur or extra heart sounds  Resp: clear  Abdomen: soft, nontender, normal bowel sounds  Incision: clean, dry and intact  Uterine Fundus: firm, 1 FB below umbilicus, nontender  Lochia: minimal  Ext: edema trace and Homans sign is negative, no sign of DVT   Assessment/Plan: 35 y.o.   POD# 2.  S/P Cesarean Delivery.  Indications: arrest of dilatation and descent                Principal Problem:   Postpartum care following  cesarean delivery (4/21) Active Problems:   Indication for care in labor or delivery   Post term pregnancy, 42 weeks   Cesarean delivery delivered   Postoperative anemia due to acute blood loss  Doing well, stable.               Regular diet as tolerated Ambulate Routine post-op care Anticipate D/C home tomorrow  Raelyn MoraDAWSON, Keaton Beichner, M, MSN, CNM 03/26/2016, 8:51 AM

## 2016-03-27 ENCOUNTER — Encounter (HOSPITAL_COMMUNITY): Payer: Self-pay | Admitting: Obstetrics & Gynecology

## 2016-03-27 MED ORDER — MAGNESIUM OXIDE 400 (241.3 MG) MG PO TABS: 400.0000 mg | ORAL_TABLET | Freq: Every day | ORAL | Status: AC

## 2016-03-27 MED ORDER — POLYSACCHARIDE IRON COMPLEX 150 MG PO CAPS: 150.0000 mg | ORAL_CAPSULE | Freq: Every day | ORAL | Status: AC

## 2016-03-27 MED ORDER — OXYCODONE-ACETAMINOPHEN 5-325 MG PO TABS: 1.0000 | ORAL_TABLET | ORAL | Status: AC | PRN

## 2016-03-27 MED ORDER — IBUPROFEN 600 MG PO TABS: 600.0000 mg | ORAL_TABLET | Freq: Four times a day (QID) | ORAL | Status: AC

## 2016-03-27 NOTE — Lactation Note (Signed)
This note was copied from a baby's chart. Lactation Consultation Note Had 11% weight loss at 64 hrs old. Had 6 voids, 9 stools, 3 emesis. BF well and mom has good colostrum. Has a recessed chin, mom knows chin tug.  Patient Name: Stacy Hulda HumphreyJane Sawyer GNFAO'ZToday's Date: Sawyer Reason for consult: Follow-up assessment;Infant weight loss   Maternal Data    Feeding Feeding Type: Breast Fed Length of feed: 35 min  LATCH Score/Interventions                      Lactation Tools Discussed/Used Tools: Pump Breast pump type: Double-Electric Breast Pump Pump Review: Setup, frequency, and cleaning;Milk Storage Initiated by:: [email or web address] Date initiated:: 03/27/16   Consult Status Consult Status: Follow-up Date: 03/27/16 Follow-up type: In-patient    Stacy DancerCARVER, Stacy Sawyer, 3:53 AM

## 2016-03-27 NOTE — Discharge Summary (Signed)
POSTOPERATIVE DISCHARGE SUMMARY:  Patient ID: Stacy HumphreyJane Quant MRN: [id number] DOB/AGE: 35/08/1981 35 y.o.  Admit date: 03/23/2016 Admission Diagnoses: 42 weeks  Discharge date:  03/27/2016 Discharge Diagnoses: POD 3 s/p cesarean section  Prenatal history: G3P1011   EDC : 03/07/2016, by Last Menstrual Period  [facility]  Primary provider : Arita Missawson  Prenatal course complicated by post-dates / previous breech with successful ECV  Prenatal Labs: ABO, Rh: --/--/A NEG (04/20 0848) / Rhophylac given Antibody: NEG (04/20 0848) Rubella: Immune (09/21 0000)  RPR: Non Reactive (04/20 0848)  HBsAg: Negative (09/21 0000)  HIV: Non-reactive (09/21 0000)  GBS: Negative (03/06 0000)   Medical / Surgical History :  Past medical history:  Past Medical History  Diagnosis Date  . Medical history non-contributory   . Indication for care in labor or delivery 03/23/2016  . Postmaturity pregnancy, greater than [redacted] weeks gestation 03/23/2016  . Postpartum care following cesarean delivery (4/21) 03/24/2016  . Postoperative anemia due to acute blood loss 03/25/2016    Past surgical history:  Past Surgical History  Procedure Laterality Date  . Wisdom tooth extraction  2015  . Wisdom tooth extraction      Family History:  Family History  Problem Relation Age of Onset  . Hypertension Father   . Hyperlipidemia Father   . Heart disease Father   . Stroke Father   . Alcohol abuse Maternal Aunt   . Diabetes Paternal Aunt   . Cancer Paternal Grandfather   . Arthritis Neg Hx   . Asthma Neg Hx   . Birth defects Neg Hx   . COPD Neg Hx   . Depression Neg Hx   . Drug abuse Neg Hx   . Early death Neg Hx   . Hearing loss Neg Hx   . Kidney disease Neg Hx   . Learning disabilities Neg Hx   . Mental illness Neg Hx   . Mental retardation Neg Hx   . Miscarriages / Stillbirths Neg Hx   . Vision loss Neg Hx   . Varicose Veins Neg Hx     Social History:  reports that she has  never smoked. She has never used smokeless tobacco. She reports that she does not drink alcohol or use illicit drugs.  Allergies: Review of patient's allergies indicates no known allergies.   Current Medications at time of admission:  Prior to Admission medications   Medication Sig Start Date End Date Taking? Authorizing Provider  Evening Primrose Oil 500 MG CAPS Take 2 capsules by mouth 2 (two) times daily. Patient takes 2 capsules orally in the morning and places 2 capsules vaginally at bedtime.   Yes Historical Provider, MD  NON FORMULARY Take 1 capsule by mouth 3 (three) times daily. Patient is taking blue cohosh.   Yes Historical Provider, MD  omega-3 acid ethyl esters (LOVAZA) 1 G capsule Take 2 g by mouth daily.    Yes Historical Provider, MD  Prenatal Vit-Fe Fumarate-FA (PRENATAL MULTIVITAMIN) TABS tablet Take 1 tablet by mouth daily at 12 noon.   Yes Historical Provider, MD   Intrapartum Course:  Admit for IOL labor with protracted labor curve and absent descent Pain management: water immersion / nitrous oxide / epidural Interventions required: cesarean section  Procedures: Cesarean section delivery on 03/24/2016  with delivery of viable newborn by Dr Juliene PinaMody  See operative report for further details APGAR (1 MIN):   APGAR (5 MINS):    Postoperative / postpartum course:  Uncomplicated with  discharge on POD 3   Discharge Instructions:  Discharged Condition: stable  Activity: pelvic rest and postoperative restrictions x 2   Diet: routine  Medications:    Medication List    TAKE these medications        Evening Primrose Oil 500 MG Caps  Take 2 capsules by mouth 2 (two) times daily. Patient takes 2 capsules orally in the morning and places 2 capsules vaginally at bedtime.     ibuprofen 600 MG tablet  Commonly known as:  ADVIL,MOTRIN  Take 1 tablet (600 mg total) by mouth every 6 (six) hours.     iron polysaccharides 150 MG capsule  Commonly known as:  NIFEREX   Take 1 capsule (150 mg total) by mouth daily.     magnesium oxide 400 (241.3 Mg) MG tablet  Commonly known as:  MAG-OX  Take 1 tablet (400 mg total) by mouth daily.     NON FORMULARY  Take 1 capsule by mouth 3 (three) times daily. Patient is taking blue cohosh.     omega-3 acid ethyl esters 1 g capsule  Commonly known as:  LOVAZA  Take 2 g by mouth daily.     oxyCODONE-acetaminophen 5-325 MG tablet  Commonly known as:  PERCOCET/ROXICET  Take 1 tablet by mouth every 4 (four) hours as needed (pain scale 4-7).     prenatal multivitamin Tabs tablet  Take 1 tablet by mouth daily at 12 noon.        Wound Care: keep clean and dry / remove honeycomb POD 5 Postpartum Instructions: [facility] discharge booklet - instructions reviewed  Discharge to: Home  Follow up :   [facility] in 6 weeks for routine postpartum visit with Raelyn Mora                Signed: Marlinda Mike CNM, MSN, [facility] 03/27/2016, 10:50 AM

## 2016-03-27 NOTE — Lactation Note (Signed)
This note was copied from a baby's chart. Lactation Consultation Note  Patient Name: Stacy Hulda HumphreyJane Tierno WUJWJ'XToday's Date: 03/27/2016 Reason for consult: Follow-up assessment;Infant weight loss Baby at 11.1% weight loss. BF 8 times in past 24 hours on average 20-30 minutes. 4 voids/2 stools in past 24 hours. Baby on left breast when LC arrived demonstrating a good suckling rhythm. RN listening with stethoscope and reports hearing few swallows. Nipple round when baby came off the breast. Mom attempted to latch baby to right breast however baby could not obtain latch. Mom's right nipple is erect but the dorsal side of nipple is more flat. Mom reports pre-pumping helps with latch on the right, she used hand pump to pre-pump receiving approx 2 ml of colostrum. After few attempts, baby was able to latch and sustained a good suckling pattern. Peds has recommended to supplement with feedings due to weight loss, Mom would like to post pump and use EBM before starting formula. Mom's breasts are filling.  Mom pumped approx 4 ml of formula, RN will demonstrate how to give this back to baby using curved tipped syringe. Will f/u with Mom and demonstrate SNS with next feeding to supplement at breast.   Maternal Data    Feeding Feeding Type: Breast Milk with Formula added Length of feed: 20 min  LATCH Score/Interventions Latch: Repeated attempts needed to sustain latch, nipple held in mouth throughout feeding, stimulation needed to elicit sucking reflex. (Mom had to pre-pump to help w/latch) Intervention(s): Adjust position;Assist with latch;Breast massage;Breast compression  Audible Swallowing: A few with stimulation  Type of Nipple: Everted at rest and after stimulation (short nipple shaft dorsal portion of nipple) Intervention(s): Hand pump;Double electric pump  Comfort (Breast/Nipple): Filling, red/small blisters or bruises, mild/mod discomfort  Problem noted: Filling Interventions   (Cracked/bleeding/bruising/blister): Double electric pump;Hand pump  Hold (Positioning): Assistance needed to correctly position infant at breast and maintain latch.  LATCH Score: 6  Lactation Tools Discussed/Used Tools: Pump Breast pump type: Double-Electric Breast Pump   Consult Status Consult Status: Follow-up Date: 03/27/16 Follow-up type: In-patient    Alfred LevinsGranger, [name] Ann 03/27/2016, 12:07 PM

## 2016-03-27 NOTE — Lactation Note (Signed)
This note was copied from a baby's chart. Lactation Consultation Note  Patient Name: Stacy Hulda HumphreyJane Shibley JXBJY'NToday's Sawyer: 03/27/2016 Reason for consult: Follow-up assessment;Infant weight loss After pre-pumping Mom latched baby to right breast, took few attempts for baby to sustain latch. Mom's right nipple is flat on the dorsal side, looks like lipstick. Baby has thick, labial frenulum, posterior lingual frenulum that appears short, some tongue humping with suck exam. Tucks both lips intermittently and demonstrates chewing at the breast intermittently. Tried #20 nipple shield to see if this helped baby have a more nutritive suckling pattern, this did help, colostrum present in nipple shield with feeding.  Tried supplement at breast using Starter SNS but baby was not transferring well so tried 425fr feeding tube/syringe at breast with nipple shield and baby transferred better with the 5 fr feeding tube. With SNS it took 20 minutes for baby to take 10 ml of formula, with 5 fr feeding tube it took approx 10 min for baby to take 10 ml.  Plan discussed with parents is to BF with feeding ques, but at least every 3 hours. Keep baby nursing for 20 minutes on 1st breast, use SNS to supplement on 2nd breast. Alternate this pattern each feeading.  Use #20 nipple shield with latching to right breast, can use with left breast if needed but baby does appear to latch more deeply to left breast. Parents to supplement with EBM/formula using 5 fr feeding tube, 20 ml today or to satisfy baby. Mom to post pump for 15 minutes after each feeding. Reviewed cleaning of nipple shield, pump pieces and feeding tube systems. OP f/u scheduled for Mom on Friday, 4-29/17 at 4:00. Call for assist as needed with feedings.  Maternal Data    Feeding Feeding Type: Breast Milk Length of feed: 7 min  LATCH Score/Interventions Latch: Repeated attempts needed to sustain latch, nipple held in mouth throughout feeding, stimulation needed to elicit  sucking reflex. (latched better with nipple shield) Intervention(s): Adjust position;Assist with latch  Audible Swallowing: A few with stimulation Intervention(s): Hand expression  Type of Nipple: Everted at rest and after stimulation Intervention(s): Hand pump;Double electric pump  Comfort (Breast/Nipple): Filling, red/small blisters or bruises, mild/mod discomfort  Problem noted: Mild/Moderate discomfort Interventions (Mild/moderate discomfort): Pre-pump if needed;Post-pump  Hold (Positioning): Assistance needed to correctly position infant at breast and maintain latch. Intervention(s): Breastfeeding basics reviewed;Support Pillows;Position options;Skin to skin  LATCH Score: 6  Lactation Tools Discussed/Used Tools: Nipple Shields Nipple shield size: 20 Breast pump type: Double-Electric Breast Pump   Consult Status Consult Status: Follow-up Sawyer: 03/28/16 Follow-up type: In-patient    Alfred LevinsGranger, Nelson Julson Ann 03/27/2016, 5:06 PM

## 2016-03-27 NOTE — Progress Notes (Signed)
POSTOPERATIVE DAY # 3 S/P CS - arrest of dilation / descent  S:         Reports feeling well - frustrated with pediatrist care & upset over weight loss in newborn             Tolerating po intake / no nausea / no vomiting / + flatus / no BM             Bleeding is light             Pain controlled with Tylenol and Motrin             Up ad lib / ambulatory/ voiding QS  Newborn breast feeding   O:  VS: BP 149/88 mmHg  Pulse 82  Temp(Src) 98.4 F (36.9 C) (Oral)  Resp 19  Ht 4\' 11"  (1.499 m)  Wt 72.576 kg (160 lb)  BMI 32.30 kg/m2  SpO2 100%  LMP 06/01/2015  Breastfeeding? Unknown   LABS:               Recent Labs  03/25/16 0609  WBC 11.5*  HGB 9.4*  PLT 167               Bloodtype: --/--/A NEG (04/20 0848) / no Rhogam indicated ( newborn RH negative)  Rubella: Immune (09/21 0000)                                Physical Exam:             Alert and Oriented X3  Abdomen: soft, non-tender, non-distended              Fundus: firm, non-tender, Ueven             Dressing intact honeycomb              Incision:  approximated with suture / no erythema / no ecchymosis / no drainage  Perineum: intact  Lochia: light  Extremities: 1+ pedal edema, no calf pain or tenderness, negative Homans  A:        POD # 3 S/P CS            Mild ABL anemia  P:        Routine postoperative care              DC home - rooming-in with newborn due to weight loss 7% s/p NPO status and barium contrast for GI study   Marlinda MikeBAILEY, TANYA CNM, MSN, [facility] 03/27/2016, 10:46 AM

## 2016-03-28 ENCOUNTER — Ambulatory Visit: Payer: Self-pay

## 2016-03-28 NOTE — Lactation Note (Signed)
This note was copied from a baby's chart. Lactation Consultation Note  Patient Name: Boy Hulda HumphreyJane Danh WUJWJ'XToday's Date: 03/28/2016 Reason for consult: Follow-up assessment;Breast/nipple pain;Difficult latch;Infant weight loss  Visited with Mom and FOB on day of discharge, baby 2994 hrs old.  Mom now pumping >30 ml each time.  Breasts feel full, not engorged.  Parents visibly and admittedly exhausted.  Very anxious for LC to observe and help with the next feeding.  Mom positioning baby in cross cradle hold.  Recommended she support breast using U hold to facilitate a deeper areolar grasp when latched.  Baby opens well, and latches onto 20 mm nipple shield which Mom is preferring to use as nipples both traumatized by prior latches.  Baby latched with lower lip tucked and jaw not extended wide.  Showed FOB how to successfully pull down on baby's chin to open his mouth wider, and uncurl lower lip.  Mom felt instant relief with this.  Showed both parents how a deep areolar latch looks like.  Baby slowly, but rhythmically sucking and swallowing.  Demonstrated alternate breast compression to increase milk transfer.  Helped Mom recognize swallows during the feeding.  Baby still to be supplemented following feeding, to increase to 30 ml EBM by syringe, or 5 fr feeding tube on 2nd breast.  Talked about using a slow flow bottle, and instructed on use of "pace method" of feeding.  Mom says she is so exhausted, she may choose to pump only at a feeding.  Reassured her that rest is very important as well.  Parents have been feeding and supplementing and pumping very consistently and praised for their hard work..  OP Lactation appointment scheduled for 03/31/16 @ 4pm.  Pediatrician appointment for 03/30/16.   Consult Status Consult Status: Follow-up Date: 03/31/16 Follow-up type: Out-patient    Judee ClaraSmith, Elizabet Schweppe E 03/28/2016, 9:14 AM

## 2016-03-31 ENCOUNTER — Ambulatory Visit (HOSPITAL_COMMUNITY): Payer: BC Managed Care – PPO

## 2016-04-03 ENCOUNTER — Encounter (HOSPITAL_COMMUNITY): Payer: Self-pay

## 2017-07-21 IMAGING — US US OB COMP LESS 14 WK
1 series · 15 of 28 positions shown · non-contrast
Comparison: None for this pregnancy

CLINICAL DATA: 34-year-old pregnant female with vaginal bleeding.

EXAM:
OBSTETRIC <14 WK US AND TRANSVAGINAL OB US
TECHNIQUE: Transabdominal ultrasound examinations were performed for evaluation
of the gestation as well as the maternal uterus, adnexal regions,
and pelvic cul-de-sac.

[Series 1: us ob comp less 14 wk · 15 of 47 slices shown]
[im 1/47]
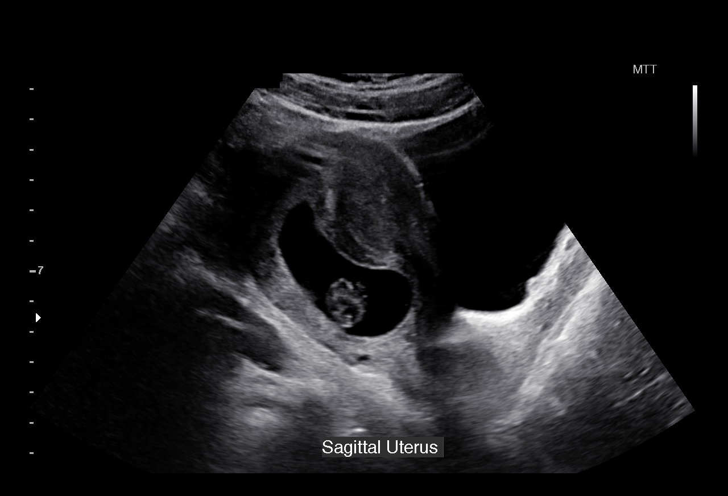
[im 4/47]
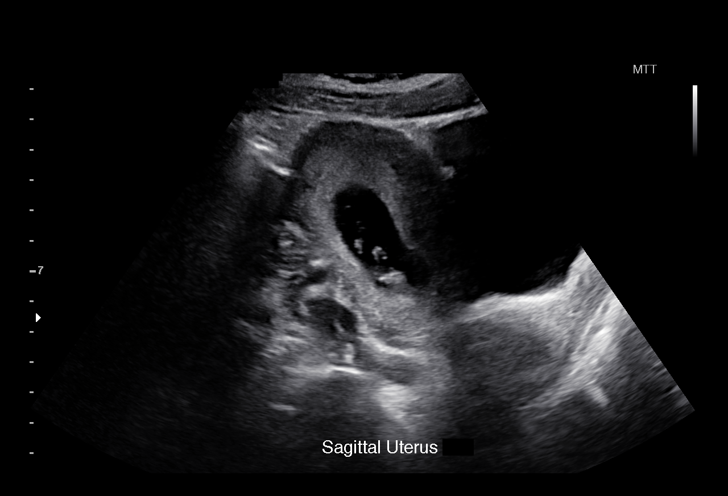
[im 7/47]
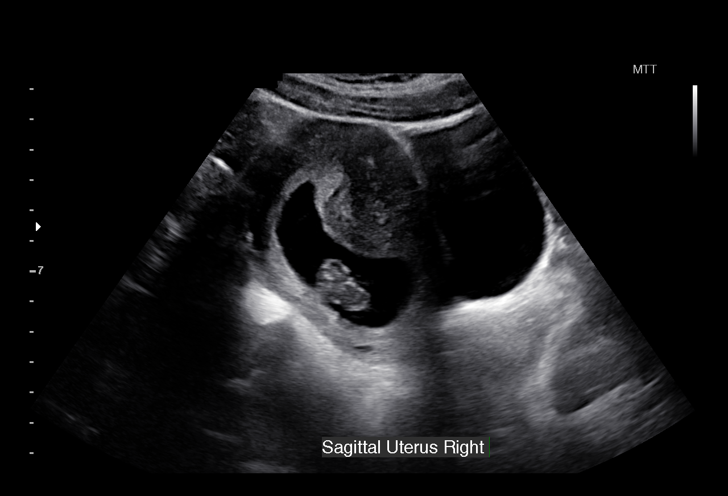
[im 11/47]
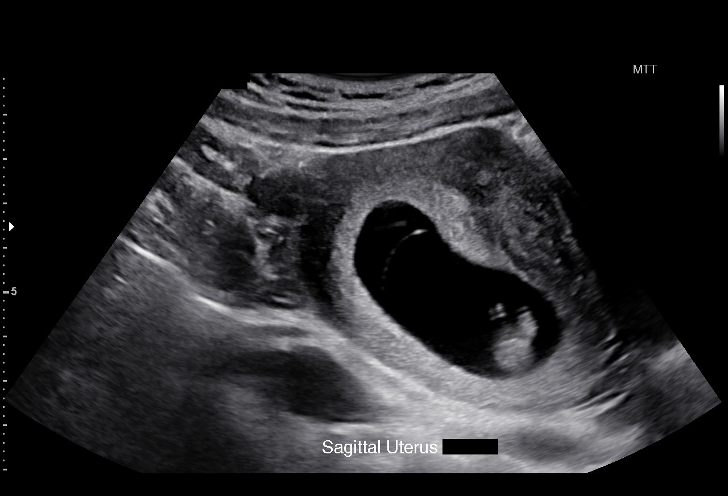
[im 14/47]
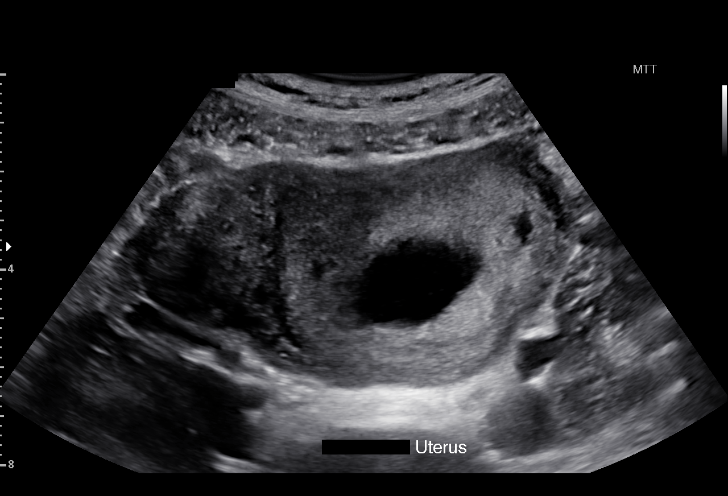
[im 18/47]
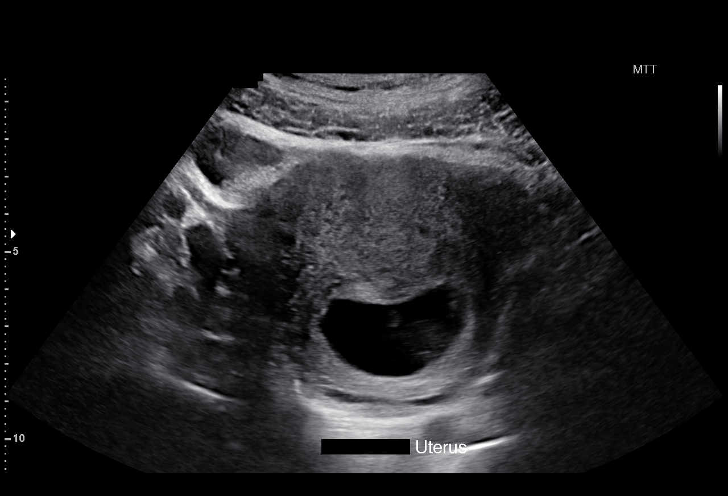
[im 21/47]
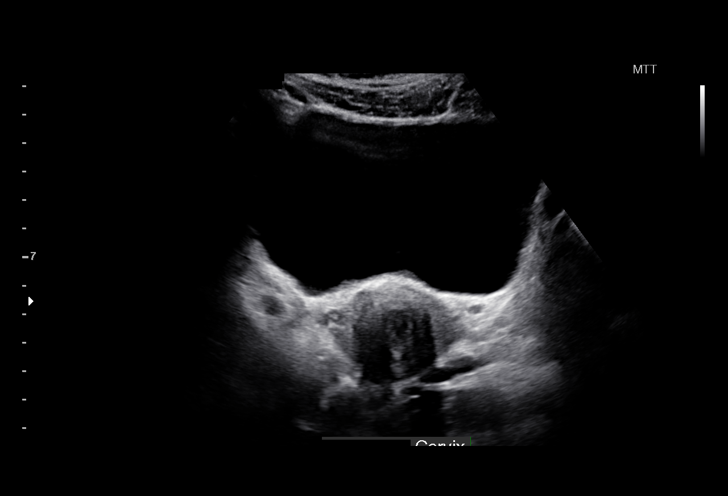
[im 24/47]
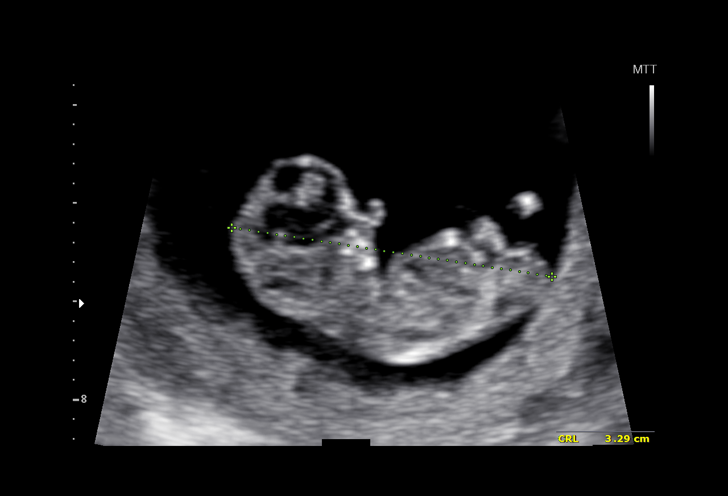
[im 26/47]
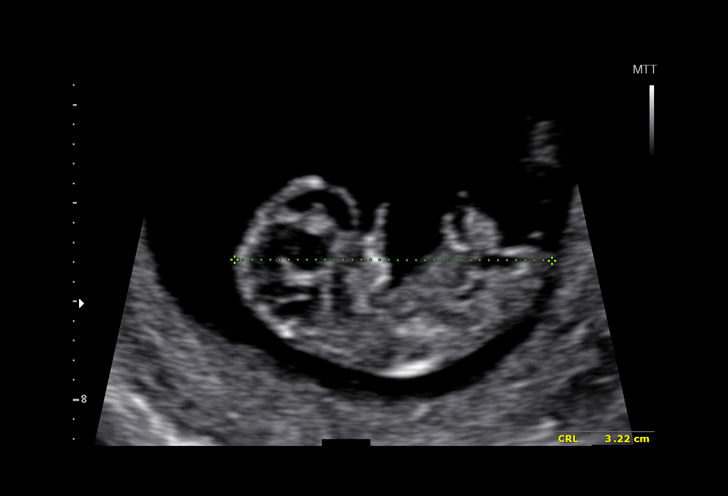
[im 29/47]
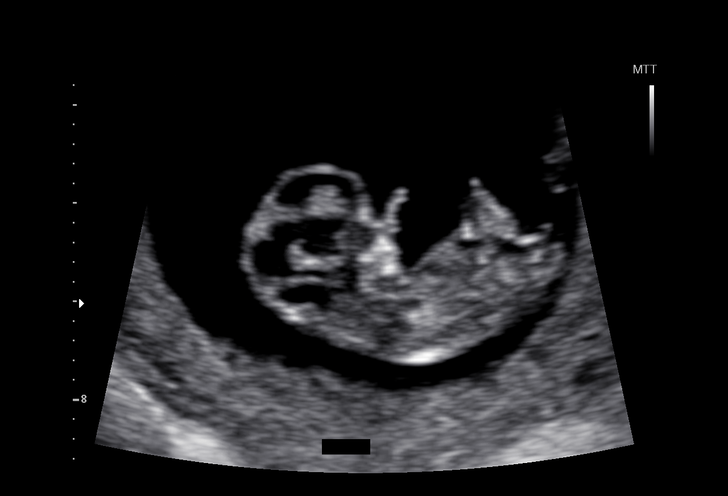
[im 33/47]
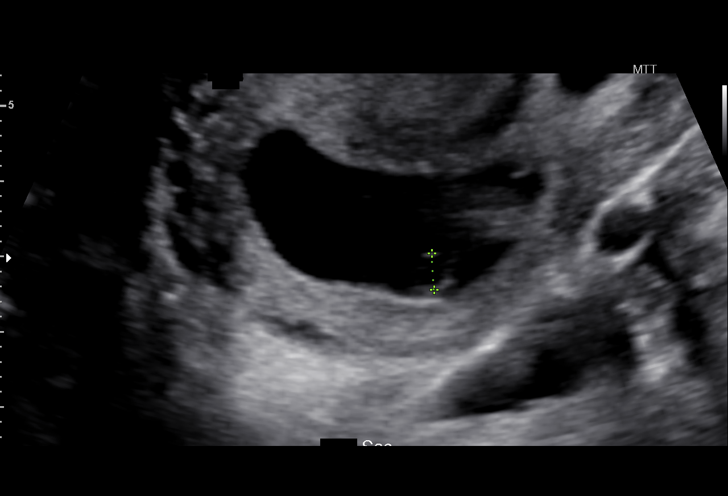
[im 36/47]
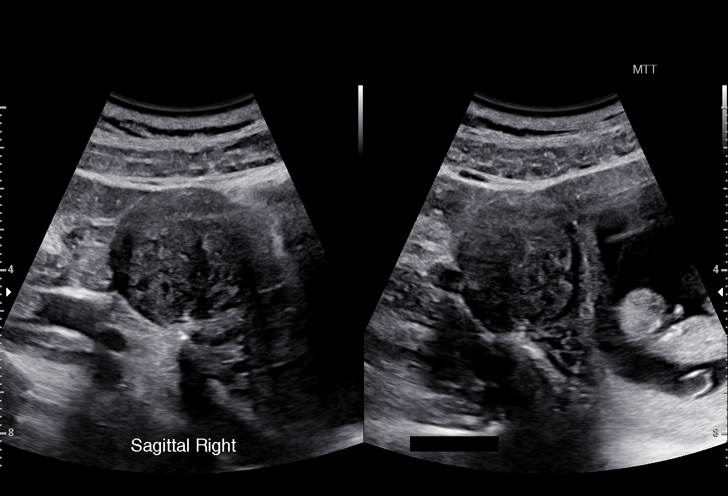
[im 40/47]
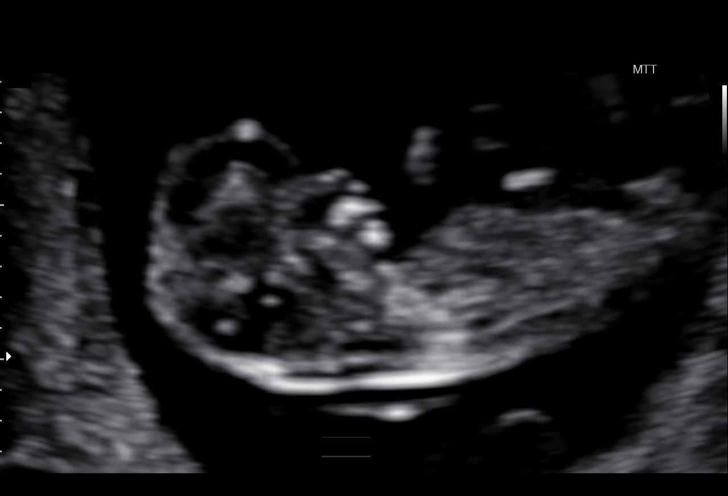
[im 43/47]
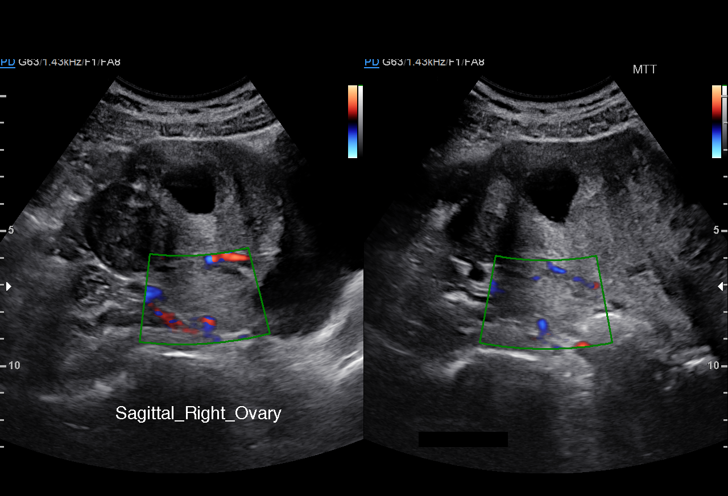
[im 47/47]
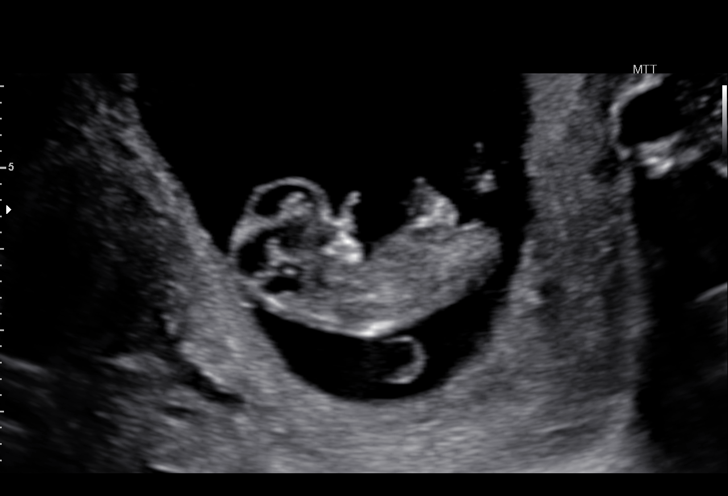

[15 of 28 positions shown; findings below may reference images not displayed]

FINDINGS: Intrauterine gestational sac: Visualized/normal in shape.

Yolk sac:  Present

Embryo:  Seen

Cardiac Activity: Detected

Heart Rate: 170  bpm

CRL:  33  mm   10 w   1 d                  US EDC: 03/06/2016

Maternal uterus/adnexae: The ovaries are grossly unremarkable. A
probable left ovarian corpus luteum noted. There are multiple
uterine fibroids the largest measuring 3.3 x 3.3 x 3.0 cm in the
right uterus.
IMPRESSION: Single live intrauterine pregnancy with an estimated gestational age
of 10 weeks, 1 day.

## 2021-06-07 ENCOUNTER — Encounter: Admit: 2021-06-07 | Payer: PRIVATE HEALTH INSURANCE | Attending: Vascular and Interventional Radiology

## 2021-06-07 DIAGNOSIS — S83512D Sprain of anterior cruciate ligament of left knee, subsequent encounter: Secondary | ICD-10-CM

## 2021-06-14 ENCOUNTER — Inpatient Hospital Stay: Admit: 2021-06-14 | Discharge: 2021-06-14 | Payer: BLUE CROSS/BLUE SHIELD

## 2021-06-14 NOTE — Other
[facility] [place] [address]WUJWJ Number: [phone] Number: [id number] Therapy No Show Note7/12/2022Patient Name:  Rose Cochrane ElkinsMedical Record Number:  [id number] Date of Birth:  [birth year]Therapist:  Nadyne Coombes, PTJane Magdalene Bryan was a no show for an appointment today.

## 2021-06-22 ENCOUNTER — Inpatient Hospital Stay: Admit: 2021-06-22 | Discharge: 2021-06-22 | Payer: BLUE CROSS/BLUE SHIELD

## 2021-06-22 ENCOUNTER — Encounter: Admit: 2021-06-22 | Payer: PRIVATE HEALTH INSURANCE

## 2021-06-22 NOTE — Other
[facility] [address] [place] [address]WUJWJ Number: [phone] Number: [phone] Therapy Orthopedic EvaluationPatient Name:  Rose Standlee ElkinsMedical Record Number:  [id number] Date of Birth:  16-Mar-1982Therapist:  Nadyne Coombes, PTReferring Provider: Dr Gus Height Diagnosis(es):Problem List         ICD-10-CM   PT 06/22/21 knee pain, ACLR left 03/31/21  * (Principal) Difficulty walking R26.2  Your signature indicates that you have reviewed and agree with the established Plan of Care as documented below. This order renewal is required for therapy to continue on an outpatient basis. Please co-sign this document electronically or return via the fax number listed on the document.  Your prompt response is required and appreciated. Additional Physician Comments:   ___________________________________       __________________________________Physician Signature                                             Date___________________________________Physician Printed NameGeneral InformationTherapy Episode of Care  Date of Visit:  06/22/2021   Treatment Number:  1   Date the Treatment Plan was Initiated/Reviewed:  06/22/2021  Start of Care Date:  06/22/2021   Onset of Illness/Injury Date:  06/07/2021   Date of Surgery:  03/31/2021 Cognition / Learning Assessment   Primary Learner Relationship:  Patient        Barriers to learning:  No barriers        Preferred language:  English        Preferred learning style:  Pictures/Video and DemonstrationI reviewed the Patient Care Agreement and Attendance Form with the Patient/Family.  The Patient/Family verbalized understanding.Medication Review:No current outpatient medications on file. SubjectivePt notes that she is sore and feels like she is not walking very well.  I am walking more here but I don't think I am walking right' Pertinent History of Current Problem:  Pt is a 40 yo female who was referred to PT following an ACLR and meniscal repair performed 03/31/21 while she is visiting family. Pt notes that she was supposed to attend PT sooner but she did organize the schedule. Pt reports that she is returning home in a day or tow. Pt notes that she had an immobilizer which she wore following surgery , used crutches and was non weight bearing for weeks. Pt states that she had been consistently participating in PT after surgery, even increased frequency when she felt her progress was slower than expected. Pt notes that she has noted improvement in her mobility, balance and gait but she continues to feel weak with stair climbing, prolonged walking, squatting, transfers. Pt notes interrupted sleep but is unsure if its related to her knee or something else. Pt reports that she swims daily but is concerned about the persistent swelling and  pain in her knee months after surgeryPt notes that she is driving, independent with self care, household chores, meal prep but is cautious and limited with functional mobility still. Past Medical HistoryNo past medical history on file.Past Surgical HistoryPast Surgical History: Procedure Laterality Date ? ANTERIOR CRUCIATE LIGAMENT REPAIR  03/31/2021  medial, lateral meniscal repair , ACL repair with quad tendon AllergiesPatient has no allergy information on record.Pain Rating:  2 / 10 left knee at rest, 4/10 after activity and end of the day   Social / Emotional Information:  Pt is visiting family in [place] but resides in [place]. Pt notes that she will  be moving in the next 1-2 months into a different apartmentPrior Level of Function:  Functional mobility without pain or limitations , playing with her son physically ,    Change in Status from prior level of function?  Yes ObjectivePosture: rounded shouldersSlightly flexed left knee MMT/ROM:AROM knees Right 6-128 degreesLeft -04-1220 degrees Strength Joint Mobility:Hypomobility:  Medial patellar glide left , fibular glide left Palpation:TTP medial joint line, quad and patellar tendons , distal ITB Sensation:Light Touch:  Impaired left patella- pt notes I feel it but its like its numbBalance:Tandem eyes open 15 sec each Single leg eyes open 15 sec each , soreness left Special Tests+ Thomas test+ Ober left Straight left raise  Right 75 degrees, left 55 degrees Functional MobilityBed Mobility:  Use of bilateral upper extremities, with difficulty, pain in her left knee with rolling Transfers:  Use of bilateral upper extremities , slow and guarded Ambulation:  Decreased heel strike left, lack of tke , decreased cadence and decreased push off      Assistive Device:  NoneOrthopedic Tools/Scales/Outcome MeasuresLower Extremity Functional Scale      Any of your usual work/household/school activities:  4      Your usual hobbies/recreation/sporting activities:  2      Getting into or out of the bath:  3      Walking between rooms:  3      Putting on your socks:   3      Squatting:  3      Lifting an object from the floor:  2      Performing light activities around your home:  4      Performing heavy activities around your home:  0      Getting into or out of your car:  3      Walking 2 blocks:  3      Walking a mile:  1      Go up or down 10 stairs:  1      Standing for 1 hour:  1      Sitting for 1 hour:  3      Running on even ground:  0      Running on uneven ground:  0      Making sharp turns while running fast:  0      Hopping:  0      Rolling over in bed:  2   Total Score (out of a possible 80):  38   76-78/80 is normal for population   Minimal detectable change = 9 points   Minimal clinically important difference = 9 points   Potential error +/- 5.3 pointsTreatment Provided This VisitCPT Code Interventions Timed Minutes Untimed Minutes Total Minutes Physical Therapy Evaluation - Moderate Complexity (97162) IE performed     Manual Therapy ([address])     Therapeutic Exercise (97110) Home exercises - hamstring stretch, leg lengthener with and without leg press, bridging, marching with abdominal bracing, hip abduction with red tband above the kneekinesiotaping - patellar placement , modified figure 8 , lymph drainage to popliteal region     Gait Training (60454)       Total Treatment Time:  AssessmentPt is a good candidate for medically skilled physical therapy. PT to address residual deficits following ACLR and meniscal repair 03/31/21. Pt continues to have difficulty walking , gait deviations and weakness in left lower extremity. Pt notes that she will resume swimming when she returns home. DC PT at this time, pt returning home after  the evaluation and intends to resume PT near home. No further treatment to be rendered at this time, pt is traveling home. RecommendationsKinesiotaping Pool exercises Gait training

## 2021-07-03 DIAGNOSIS — S83512D Sprain of anterior cruciate ligament of left knee, subsequent encounter: Secondary | ICD-10-CM
